# Patient Record
Sex: Female | Born: 1990 | Race: Black or African American | Hispanic: No | Marital: Single | Smoking: Never smoker
Health system: Southern US, Community
[De-identification: ages and names within clinical notes are randomized; demographics above are authoritative.]

## PROBLEM LIST (undated history)

## (undated) ENCOUNTER — Inpatient Hospital Stay (HOSPITAL_COMMUNITY): Payer: Self-pay

## (undated) ENCOUNTER — Emergency Department (HOSPITAL_COMMUNITY): Disposition: A | Discharge status: Home / Self Care | Payer: Self-pay

## (undated) HISTORY — PX: DILATION AND CURETTAGE OF UTERUS: SHX78

---
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
## 2008-03-30 ENCOUNTER — Emergency Department (HOSPITAL_COMMUNITY): Admission: EM | Admit: 2008-03-30 | Discharge: 2008-03-30 | Payer: Self-pay | Admitting: Emergency Medicine

## 2009-10-09 ENCOUNTER — Ambulatory Visit (HOSPITAL_BASED_OUTPATIENT_CLINIC_OR_DEPARTMENT_OTHER): Admission: RE | Admit: 2009-10-09 | Discharge: 2009-10-09 | Payer: Self-pay | Admitting: Obstetrics

## 2009-10-12 ENCOUNTER — Ambulatory Visit: Payer: Self-pay | Admitting: Internal Medicine

## 2011-01-08 ENCOUNTER — Ambulatory Visit (HOSPITAL_BASED_OUTPATIENT_CLINIC_OR_DEPARTMENT_OTHER): Payer: Medicaid Other | Attending: Obstetrics

## 2011-02-06 NOTE — Consult Note (Signed)
[name], [name] NO.:  [id number]   MEDICAL RECORD NO.:  [id number]          PATIENT TYPE:  INP   LOCATION:  9121                          FACILITY:  [facility]   PHYSICIAN:  Marlan Palau, M.D.  DATE OF BIRTH:  Nov 25, 1990   DATE OF CONSULTATION:  11/02/2006  DATE OF DISCHARGE:                                 CONSULTATION   HISTORY OF PRESENT ILLNESS:  Cynthia [name] this an immature, 20 year old  black female, right handed, born 04-Aug-1991, with a history of  delivery of first child on November 01, 2006.  The patient received  epidural anesthesia for the procedure.  Following the procedure on  February 11 as well as on November 02, 2006, the patient has been  complaining of some numbness in the lower anterior right thigh and  weakness in that leg with a tendency to fall.  The patient has been  seen, evaluated by anesthesiology who does not believe that the  anesthesia itself is a contributing factor.  The patient reports no pain  in the back or down the leg on either side.  Patient has no neck pain.  The patient denies any problems controlling the bowels or bladder.  For  this reason, neurology was asked to see the patient for further  evaluation.   PAST MEDICAL HISTORY:  Is significant for:  1. Right leg weakness following delivery with functional examination.  2. Status post delivery of first child November 01, 2006.   The patient does not smoke or drink.   ALLERGIES:  No known allergies.   CURRENT MEDICATIONS:  1. Iron 325 mg b.i.d.  2. Senokot 1-2 at night.  3. Tylenol if needed.  4. Tylox if needed.   SOCIAL HISTORY:  [place]; is single; lives with her parents.  This is her first child.   FAMILY MEDICAL HISTORY:  Notable that both parents are living in good  health.  The patient has one brother and one sister who are in good  health. Sister just delivered another child in the last several  days.   REVIEW OF SYSTEMS:  Notable for no recent fevers, chills.  The patient  denies headache, neck pain, shortness of breath, chest pains, abdominal  pain, nausea, vomiting, troubles controlling the bowels or bladder.  Denies any blackout episodes or dizziness.   PHYSICAL EXAMINATION:  VITAL SIGNS: Blood pressure 126/74, heart rate  72, respiratory rate 18, temperature afebrile.  GENERAL:  This patient is a minimally obese black female who is immature  with baby talk.  HEENT:  Head is atraumatic.  Eyes:  Pupils are equal, round and react to  light.  Disks are flat bilaterally.  NECK:  Supple.  No carotid bruits noted.  RESPIRATORY:  Examination is clear.  CARDIOVASCULAR:  Examination reveals a regular rate and rhythm.  No  obvious murmurs, rubs noted.  EXTREMITIES:  Are notable for 2-3+ edema on the arms and legs.  NEUROLOGIC:  Cranial nerves as above.  Facial symmetry is noted.  The  patient has good sensation  of face to pinprick, soft touch bilaterally.  Has good strength to facial muscles, muscles to head turning and  shoulder shrug bilaterally.  Speech is well enunciated, not aphasic.  Motor testing reveals 5/5 strength in all fours with exception that she  gives no effort whatsoever with extension of the knee and with elevation  the leg, will not flex at the hip or extend at the knee.  The patient  otherwise has good strength throughout.  Subsequently on cerebellar  testing, the patient readily performs heel-to-shin with the right leg  and the left, able to lift and extend and flex the right leg at the knee  and hip where moments ago this was totally flaccid, and no antigravity  movement and no visible contraction was seen.  Patient has good finger-  nose-finger bilaterally.  The patient is ambulatory, can walk  independently, can walk tandem gait, and can ambulate on the heels and  toes.  The patient's subsequently will fall backwards, staggering  around, falling across the  bed.  Deep tendon reflexes depressed but  symmetric.  Toes neutral bilaterally.   LABORATORY VALUES:  Are notable for a significant anemia with a  hemoglobin of 7.1, white count 14.4, MCV of 80.6, platelets of 136.  Sodium 136, potassium 3.9, chloride 109, CO2 22, glucose 104, BUN 9,  creatinine 0.83, alkaline phosphatase of 154, SGOT of 21, SGPT of 12,  total protein 6.3, albumin 2.9, calcium 9.1. Uric acid 5.3. LDH 167.  Urinalysis reveals specific gravity of 1.005, pH 6.0.   IMPRESSION:  1. Right lower extremity weakness that is functional in nature.  2. Significant anemia, hemoglobin of 7.1.   This patient's clinical examination confirms of functional examination.  The patient at one moment has no visible motor contraction of the  iliopsoas or quadriceps muscles, at other time moments later is able to  use the leg normally.  There is no indication for further neurologic  workup at this point.  The patient may be discharged on the usual  planned schedule.      Marlan Palau, M.D.  Electronically Signed     CKW/MEDQ  D:  11/02/2006  T:  11/02/2006  Job:  [id number]   cc:   [facility]  [address]   Kathreen Cosier, M.D.  Fax: [phone]

## 2011-02-06 NOTE — Op Note (Signed)
NAMEFRANCIA, VERRY               ACCOUNT NO.:  [id number]   MEDICAL RECORD NO.:  [id number]          PATIENT TYPE:  INP   LOCATION:  9169                          FACILITY:  [facility]   PHYSICIAN:  Kathreen Cosier, M.D.DATE OF BIRTH:  1990-11-30   DATE OF PROCEDURE:  11/01/2006  DATE OF DISCHARGE:                               OPERATIVE REPORT   The patient is a 20 year old prima gravida at term who became fully  dilated at 5:30 a.m. on February 11, posterior +3 station, needed  assistance.  Midline episiotomy cut vacuum applied through 1  contraction.  No popoff until delivery achieved.  She had a female, Apgars  of 8 and 9 weighing 7 pounds 7 ounces LOA, fourth degree repaired in  layers.  Rectum and placenta, anus intact.  Placenta spontaneous.  The  patient tolerated procedure well.           ______________________________  Kathreen Cosier, M.D.     BAM/MEDQ  D:  11/01/2006  T:  11/01/2006  Job:  [id number]

## 2011-02-23 ENCOUNTER — Ambulatory Visit (HOSPITAL_BASED_OUTPATIENT_CLINIC_OR_DEPARTMENT_OTHER): Payer: Medicaid Other

## 2011-03-10 ENCOUNTER — Ambulatory Visit (HOSPITAL_BASED_OUTPATIENT_CLINIC_OR_DEPARTMENT_OTHER): Payer: Medicaid Other | Attending: Obstetrics

## 2011-03-10 DIAGNOSIS — G4710 Hypersomnia, unspecified: Secondary | ICD-10-CM | POA: Insufficient documentation

## 2011-03-15 DIAGNOSIS — G4730 Sleep apnea, unspecified: Secondary | ICD-10-CM

## 2011-03-15 DIAGNOSIS — G4710 Hypersomnia, unspecified: Secondary | ICD-10-CM

## 2011-03-16 NOTE — Procedures (Signed)
[name], Cynthia [name]               ACCOUNT NO.:  [id number]  MEDICAL RECORD NO.:  [id number]          PATIENT TYPE:  OUT  LOCATION:  SLEEP CENTER                 FACILITY:  [facility]  PHYSICIAN:  Clinton D. Maple Hudson, MD, FCCP, FACPDATE OF BIRTH:  [birth year]04[birth year]1988  DATE OF STUDY:  03/10/2011                           NOCTURNAL POLYSOMNOGRAM  REFERRING PHYSICIAN:  Kathreen Cosier, M.D.  INDICATION FOR STUDY:  Hypersomnia with sleep apnea.  EPWORTH SLEEPINESS SCORE:  23/24, BMI 26.7.  Weight 132 pounds, height 59 inches.  Neck 12.5 inches.  HOME MEDICATION:  None listed.  Baseline diagnostic NPSG on October 09, 2009, recorded an AHI of 0.2 per hour and RDI of 20.4 per hour.  CPAP titration is now requested.  MEDICATIONS:  SLEEP ARCHITECTURE:  Total sleep time 314 minutes with sleep efficiency 85.6%.  Stage I was 11.6%, stage II 55.9%, stage III 10.2%, REM 22.3% of total sleep time.  Sleep latency 7.5 minutes, REM latency 17 minutes, awake after sleep onset 46.5 minutes, arousal index 20.8.  BEDTIME MEDICATION:  None.  RESPIRATORY DATA:  CPAP titration protocol.  CPAP was titrated to 12 CWP, AHI 0 per hour.  She wore a small ResMed Mirage Quattro full-face mask with heated humidifier and a C-Flex in the setting of 3.  OXYGEN DATA:  Before CPAP snoring was moderate.  With CPAP titration, mean oxygen saturation was 98% on room air and snoring was prevented.  CARDIAC DATA:  Sinus rhythm with occasional PAC and PVC.  MOVEMENT-PARASOMNIA:  Occasional limb jerk with minimal effect on sleep. Bathroom x1.  IMPRESSIONS-RECOMMENDATIONS: 1. CPAP titration to 12 CWP, AHI 0 per hour.  She wore a small ResMed     Mirage Quattro full-face mask with heated humidifier and a C-Flex     in the setting of 3. 2. Baseline diagnostic NPSG on October 09, 2009, had recorded an AHI     of 0.2 per hour and RDI of 20.4 per hour.     Clinton D. Maple Hudson, MD, [facility], FACP Diplomate, Biomedical engineer of Sleep  Medicine Electronically Signed    CDY/MEDQ  D:  03/15/2011 20:45:32  T:  03/16/2011 04:47:29  Job:  [id number]

## 2011-05-22 ENCOUNTER — Ambulatory Visit (INDEPENDENT_AMBULATORY_CARE_PROVIDER_SITE_OTHER): Payer: Medicaid Other | Admitting: Internal Medicine

## 2011-05-22 ENCOUNTER — Encounter: Payer: Self-pay | Admitting: Internal Medicine

## 2011-05-22 VITALS — BP 138/70 | HR 78 | Ht 60.0 in | Wt 126.6 lb

## 2011-05-22 DIAGNOSIS — G47411 Narcolepsy with cataplexy: Secondary | ICD-10-CM

## 2011-05-22 DIAGNOSIS — G47419 Narcolepsy without cataplexy: Secondary | ICD-10-CM | POA: Insufficient documentation

## 2011-05-22 DIAGNOSIS — G4710 Hypersomnia, unspecified: Secondary | ICD-10-CM

## 2011-05-22 HISTORY — DX: Narcolepsy without cataplexy: G47.419

## 2011-05-22 NOTE — Progress Notes (Signed)
Subjective:    Patient ID: Cynthia [name], female    DOB: 12/15/1990, 20 y.o.   MRN: [id number]  HPI 05/22/11- 20 year old female never smoker referred courtesy of Dr. Francoise Ceo for sleep evaluation because of hypersomnia.  Describes attacks of irresistible sleepiness- not clearly related to emotion or laughter. Onset noted after childbirth in 2008. Denies hx drugs, trauma or similar family hx. Caffeine is no help. I reviewed her recent NPSG. I don't feel she has significant sleep apnea, although she never lost weight after childbirth and began snoring. Not told of apnea, movement or behavior disturbance during sleep. Denies hypnagogic hallucination or sleep paralysis. Never needed naps her before child was born. Now needs naps but does not find them especially refreshing. Sleep habits were reviewed: Bedtime 11 PM to midnight, sleep latency "2 seconds", waking 3 or 4 times briefly during the night for up at 7 AM. NPSG- 10/09/09- AHI 0.2 per hour, RDI 20.4 per hour intermittent moderate snoring-Epworth Sleepiness Scale or 5/24 BMI 26.5 weight 131 pounds CPAP titration- 03/10/2011 12 CWP, AHI 0 per hour Denies history of liver or thyroid disease, head trauma or meningitis, epilepsy  Review of Systems Constitutional:   No-   weight loss, night sweats, fevers, chills,    + fatigue, lassitude. HEENT:   No-  headaches, difficulty swallowing, tooth/dental problems, sore throat,       No-  sneezing, itching, ear ache, nasal congestion, post nasal drip,  CV:  No-   chest pain, orthopnea, PND, swelling in lower extremities, anasarca, dizziness, palpitations Resp: No-   shortness of breath with exertion or at rest.              No-   productive cough,  No non-productive cough,  No-  coughing up of blood.              No-   change in color of mucus.  No- wheezing.   Skin: No-   rash or lesions. GI:  No-   heartburn, indigestion, abdominal pain, nausea, vomiting, diarrhea,                 change in  bowel habits, loss of appetite GU: No-   dysuria, change in color of urine, no urgency or frequency.  No- flank pain. MS:  No-   joint pain or swelling.  No- decreased range of motion.  No- back pain. Neuro- grossly normal to observation, Or:  Psych:  No- change in mood or affect. No depression or anxiety.  No memory loss.      Objective:   Physical Exam General- Alert, Oriented, Affect-appropriate, Distress- none acute, well-developed well-nourished, not obese;  tearful Skin- rash-none, lesions- none, excoriation- none Lymphadenopathy- none Head- atraumatic            Eyes- Gross vision intact, PERRLA, conjunctivae clear secretions            Ears- Hearing, canals-normal            Nose- Clear, no-Septal dev, mucus, polyps, erosion, perforation             Throat- Mallampati II , mucosa clear , drainage- none, tonsils- atrophic Neck- flexible , trachea midline, no stridor , thyroid nl, carotid no bruit Chest - symmetrical excursion , unlabored           Heart/CV- RRR , no murmur , no gallop  , no rub, nl s1 s2                           -  JVD- none , edema- none, stasis changes- none, varices- none           Lung- clear to P&A, wheeze- none, cough- none , dullness-none, rub- none           Chest wall-  Abd- tender-no, distended-no, bowel sounds-present, HSM- no Br/ Gen/ Rectal- Not done, not indicated Extrem- cyanosis- none, clubbing, none, atrophy- none, strength- nl Neuro- grossly intact to observation         Assessment & Plan:

## 2011-05-22 NOTE — Patient Instructions (Signed)
Order [facility]- Schedule Multiple Sleep Latency Test  For dx hypersomnia, narcolepsy with cataplexy

## 2011-05-22 NOTE — Assessment & Plan Note (Signed)
I suspect this will turn out to be either idiopathic hypersomnia or narcolepsy. Treatment will probably involve stimulant meds, so it will be important to document as well as possible. We discussed and will schedule a Multiple Sleep Latency Test.

## 2011-06-08 ENCOUNTER — Ambulatory Visit (HOSPITAL_BASED_OUTPATIENT_CLINIC_OR_DEPARTMENT_OTHER): Payer: Medicaid Other

## 2011-06-11 ENCOUNTER — Other Ambulatory Visit: Payer: Self-pay | Admitting: Internal Medicine

## 2011-06-11 DIAGNOSIS — G4710 Hypersomnia, unspecified: Secondary | ICD-10-CM

## 2011-06-11 DIAGNOSIS — G47411 Narcolepsy with cataplexy: Secondary | ICD-10-CM

## 2011-06-17 ENCOUNTER — Encounter (HOSPITAL_BASED_OUTPATIENT_CLINIC_OR_DEPARTMENT_OTHER): Payer: Medicaid Other

## 2011-06-24 ENCOUNTER — Encounter (HOSPITAL_BASED_OUTPATIENT_CLINIC_OR_DEPARTMENT_OTHER): Payer: Medicaid Other

## 2011-07-06 ENCOUNTER — Ambulatory Visit (HOSPITAL_BASED_OUTPATIENT_CLINIC_OR_DEPARTMENT_OTHER): Payer: Medicaid Other | Attending: Internal Medicine

## 2011-07-06 DIAGNOSIS — G4710 Hypersomnia, unspecified: Secondary | ICD-10-CM | POA: Insufficient documentation

## 2011-07-07 ENCOUNTER — Ambulatory Visit: Payer: Medicaid Other | Admitting: Internal Medicine

## 2011-07-11 DIAGNOSIS — G4710 Hypersomnia, unspecified: Secondary | ICD-10-CM

## 2011-07-11 NOTE — Procedures (Signed)
Cynthia [name], KIDNEY               ACCOUNT NO.:  [id number]  MEDICAL RECORD NO.:  [id number]          PATIENT TYPE:  OUT  LOCATION:  SLEEP CENTER                 FACILITY:  [facility]  PHYSICIAN:  Evolet Salminen D. Maple Hudson, MD, FCCP, FACPDATE OF BIRTH:  1989-06-30  DATE OF STUDY:  07/06/2011                         MULTIPLE SLEEP LATENCY TEST  REFERRING PHYSICIAN:  Zebulen Simonis D. Haset [place], MD, FCCP, FACP  INDICATION FOR STUDY:  Hypersomnia, suspect narcoplexy.  EPWORTH SLEEPINESS SCORE:  Epworth sleepiness score 23/24.  BMI:  BMI 24, height 51 inches, weight 127 pounds, neck 12.5 inches.  A baseline diagnostic NPSG on October 09, 2009 recorded an AHI of 0.2 per hour and an RDI of 20.4 per hour with 22% REM.  Weight was 131 pounds.  MEDICATIONS:  None.  NAP 1:  9:00 a.m., sleep latency 2 minutes, REM latency 1.5 minutes.  NAP 2:  11:00 a.m., sleep latency 2 minutes, REM latency 0.5 minutes.  NAP 3:  13:00 p.m., sleep latency 1 minute, REM latency 1 minute.  NAP 4:  15:00 p.m., sleep latency 1.5 minutes, REM latency 1 minute.  NAP 5:  17:00 p.m., sleep latency 4 minutes, REM latency 0.5 minutes. No medications were taken throughout the day.  Negligible snoring. Normal heart rhythm.   MEAN SLEEP LATENCY:  NUMBER OF REM EPISODES:  COMMENTS:  IMPRESSIONS-RECOMMENDATIONS: 1. Mean sleep latency 2.1 minute with sleep onset REM associated with     all 5 naps. 2. 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  There is severe daytime     hypersomnia.     Liron Eissler D. Maple Hudson, MD, [facility], FACP Diplomate, Biomedical engineer of Sleep Medicine Electronically Signed    CDY/MEDQ  D:  07/11/2011 08:54:39  T:  07/11/2011 16:10:96  Job:  [id number]

## 2011-07-21 ENCOUNTER — Ambulatory Visit (INDEPENDENT_AMBULATORY_CARE_PROVIDER_SITE_OTHER): Payer: Medicaid Other | Admitting: Internal Medicine

## 2011-07-21 ENCOUNTER — Encounter: Payer: Self-pay | Admitting: Internal Medicine

## 2011-07-21 VITALS — BP 138/88 | HR 90 | Ht 60.0 in | Wt 127.8 lb

## 2011-07-21 DIAGNOSIS — G47419 Narcolepsy without cataplexy: Secondary | ICD-10-CM

## 2011-07-21 MED ORDER — METHYLPHENIDATE HCL ER (LA) 20 MG PO CP24: ORAL_CAPSULE | ORAL | Status: DC

## 2011-07-21 NOTE — Patient Instructions (Addendum)
Narcolepsy- information print-out for patient education  Script methylphenidate 20 mg LA---- take one before breakfast daily. You can take a second later- perhaps around lunch time, if needed.   Please call as needed

## 2011-07-21 NOTE — Assessment & Plan Note (Signed)
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 Medication and lifestyle management education done. We will start with methylphenidate.

## 2011-07-21 NOTE — Progress Notes (Signed)
Patient ID: Cynthia [name], female    DOB: June 10, 1991, 20 y.o.   MRN: [id number]  HPI 05/22/11- 20 year old female never smoker referred courtesy of Dr. Francoise Ceo for sleep evaluation because of hypersomnia.  Describes attacks of irresistible sleepiness- not clearly related to emotion or laughter. Onset noted after childbirth in 2008. Denies hx drugs, trauma or similar family hx. Caffeine is no help. I reviewed her recent NPSG. I don't feel she has significant sleep apnea, although she never lost weight after childbirth and began snoring. Not told of apnea, movement or behavior disturbance during sleep. Denies hypnagogic hallucination or sleep paralysis. Never needed naps her before child was born. Now needs naps but does not find them especially refreshing. Sleep habits were reviewed: Bedtime 11 PM to midnight, sleep latency "2 seconds", waking 3 or 4 times briefly during the night for up at 7 AM. NPSG- 10/09/09- AHI 0.2 per hour, RDI 20.4 per hour intermittent moderate snoring-Epworth Sleepiness Scale - 5/24, BMI 26.5, weight 131 pounds CPAP titration- 03/10/2011 12 CWP, AHI 0 per hour Denies history of liver or thyroid disease, head trauma or meningitis, epilepsy  07/21/11- 20 yoF never smoker followed for Narcolepsy with Hypersomnia She returns after multiple sleep latency test done 07/06/2011 which was significantly abnormal. Mean sleep latency was only 2.1 minutes (normal is over 10 minutes and severe sleepiness is considered less than 5 minutes). She had REM sleep on all 5 naps. This is strongly consistent with narcolepsy. So far, I have not gotten a history of cataplexy. We talked today about narcolepsy and available treatments. I emphasized the importance of good sleep hygiene and the preference of naps over drugs and possible. We talked very carefully about the use of controlled medications. She was initially somewhat tearful and apprehensive, but understands that we will work through  this with her.  Review of Systems Constitutional:   No-   weight loss, night sweats, fevers, chills,    + fatigue, lassitude. HEENT:   No-  headaches, difficulty swallowing, tooth/dental problems, sore throat,       No-  sneezing, itching, ear ache, nasal congestion, post nasal drip,  CV:  No-   chest pain, orthopnea, PND, swelling in lower extremities, anasarca, dizziness, palpitations Resp: No-   shortness of breath with exertion or at rest.              No-   productive cough,  No non-productive cough,  No-  coughing up of blood.              No-   change in color of mucus.  No- wheezing.   Skin: No-   rash or lesions. GI:  No-   heartburn, indigestion, abdominal pain, nausea, vomiting, diarrhea,                 change in bowel habits, loss of appetite GU: No-   dysuria, change in color of urine, no urgency or frequency.  No- flank pain. MS:  No-   joint pain or swelling.  No- decreased range of motion.  No- back pain. Neuro- grossly normal to observation, Or:  Psych:  No- change in mood or affect. No depression or anxiety.  No memory loss.      Objective:   Physical Exam General- Alert, Oriented, Affect-appropriate, Distress- none acute, well-developed well-nourished, not obese;  tearful Skin- rash-none, lesions- none, excoriation- none Lymphadenopathy- none Head- atraumatic            Eyes- Gross  vision intact, PERRLA, conjunctivae clear secretions            Ears- Hearing, canals-normal            Nose- Clear, no-Septal dev, mucus, polyps, erosion, perforation             Throat- Mallampati II , mucosa clear , drainage- none, tonsils- atrophic Neck- flexible , trachea midline, no stridor , thyroid nl, carotid no bruit Chest - symmetrical excursion , unlabored           Heart/CV- RRR , no murmur , no gallop  , no rub, nl s1 s2                           - JVD- none , edema- none, stasis changes- none, varices- none           Lung- clear to P&A, wheeze- none, cough- none ,  dullness-none, rub- none           Chest wall-  Abd- tender-no, distended-no, bowel sounds-present, HSM- no Br/ Gen/ Rectal- Not done, not indicated Extrem- cyanosis- none, clubbing, none, atrophy- none, strength- nl Neuro- grossly intact to observation

## 2011-07-27 ENCOUNTER — Encounter: Payer: Self-pay | Admitting: Internal Medicine

## 2011-08-17 ENCOUNTER — Inpatient Hospital Stay (HOSPITAL_COMMUNITY): Payer: Medicaid Other

## 2011-08-17 ENCOUNTER — Encounter (HOSPITAL_COMMUNITY): Payer: Self-pay | Admitting: *Deleted

## 2011-08-17 ENCOUNTER — Inpatient Hospital Stay (HOSPITAL_COMMUNITY)
Admission: AD | Admit: 2011-08-17 | Discharge: 2011-08-17 | Disposition: A | Discharge status: Home / Self Care | Payer: Medicaid Other | Source: Ambulatory Visit | Attending: Obstetrics | Admitting: Obstetrics

## 2011-08-17 DIAGNOSIS — O210 Mild hyperemesis gravidarum: Secondary | ICD-10-CM | POA: Insufficient documentation

## 2011-08-17 DIAGNOSIS — O219 Vomiting of pregnancy, unspecified: Secondary | ICD-10-CM

## 2011-08-17 LAB — WET PREP, GENITAL: Clue Cells Wet Prep HPF POC: NONE SEEN

## 2011-08-17 LAB — URINALYSIS, ROUTINE W REFLEX MICROSCOPIC
Ketones, ur: >80 mg/dL — AB
Nitrite: NEGATIVE
Specific Gravity, Urine: >1.03 — ABNORMAL HIGH (ref 1.005–1.030)
Urobilinogen, UA: 0.2 mg/dL (ref 0.0–1.0)

## 2011-08-17 LAB — COMPREHENSIVE METABOLIC PANEL
AST: 14 U/L (ref 0–37)
Alkaline Phosphatase: 41 U/L (ref 39–117)
CO2: 21 mEq/L (ref 19–32)
Calcium: 10.2 mg/dL (ref 8.4–10.5)
Creatinine, Ser: 0.77 mg/dL (ref 0.50–1.10)
Glucose, Bld: 85 mg/dL (ref 70–99)
Total Bilirubin: 0.7 mg/dL (ref 0.3–1.2)
Total Protein: 7.5 g/dL (ref 6.0–8.3)

## 2011-08-17 LAB — POCT PREGNANCY, URINE: Preg Test, Ur: POSITIVE

## 2011-08-17 LAB — URINE MICROSCOPIC-ADD ON

## 2011-08-17 LAB — CBC
HCT: 33.8 % — ABNORMAL LOW (ref 36.0–46.0)
MCH: 26.7 pg (ref 26.0–34.0)
MCV: 77.2 fL — ABNORMAL LOW (ref 78.0–100.0)
Platelets: 234 10*3/uL (ref 150–400)
RDW: 15.3 % (ref 11.5–15.5)

## 2011-08-17 MED ORDER — PROMETHAZINE HCL 25 MG PO TABS: 12.5000 mg | ORAL_TABLET | Freq: Four times a day (QID) | ORAL | Status: DC | PRN

## 2011-08-17 MED ORDER — LACTATED RINGERS IV SOLN
INTRAVENOUS | Status: DC
  Administered 2011-08-17: 16:00:00 via INTRAVENOUS

## 2011-08-17 MED ORDER — ONDANSETRON HCL 4 MG/2ML IJ SOLN
4.0000 mg | Freq: Once | INTRAMUSCULAR | Status: AC
  Administered 2011-08-17: 4 mg via INTRAVENOUS
  Filled 2011-08-17: qty 2

## 2011-08-17 NOTE — ED Provider Notes (Signed)
History    Cynthia [name] is a 20 YO female presents today with intermittent nausea and vomiting for 4 days.  HPI Patient reports she was otherwise healthy 4 days ago on Thanksgiving day. It wasn't until late afternoon did she begin to feel nauseated. She had one episode of vomiting that night and it had resolved itself. The two days following that patient reports she felt fine, no episodes of vomiting or nausea. Patient denies problems with urinating or with bowel movements. Yesterday afternoon, patient suddenly felt extremely nauseated while at the store and had to vomit in the restroom. She has since been suffering from nausea and vomiting throughout the night and today. Patient has not taken any medications and has been unable to keep food or liquids down.   Patient reports she often feels chills then gets warm after vomiting episodes. Denies fevers, headaches, recent illness. Patient's last menstrual period was mid October, she was unsure of specific dates. Patient currently not taking birth control.  Past Medical History  Diagnosis Date  . Narcolepsy     Past Surgical History  Procedure Date  . No past surgeries     No family history on file.  History  Substance Use Topics  . Smoking status: Never Smoker   . Smokeless tobacco: Never Used  . Alcohol Use: Yes     socially    Allergies: No Known Allergies  Prescriptions prior to admission  Medication Sig Dispense Refill  . methylphenidate (RITALIN LA) 20 MG 24 hr capsule 1 twice daily as neeeded  50 capsule  0    Review of Systems  Constitutional: Positive for chills. Negative for fever.  Gastrointestinal: Positive for nausea, vomiting and abdominal pain.  Genitourinary: Negative for dysuria.  Neurological: Negative for headaches.   Physical Exam   Blood pressure 128/75, pulse 70, temperature 98.6 F (37 C), temperature source Oral, resp. rate 18, height 4\' 11"  (1.499 m), weight 54.885 kg (121 lb), last menstrual  period 07/19/2011.  Physical Exam  Constitutional: She is oriented to person, place, and time. She appears well-developed and well-nourished.  Cardiovascular: Normal rate, regular rhythm and normal heart sounds.   Respiratory: Effort normal and breath sounds normal.  GI: Soft. Bowel sounds are normal. There is no tenderness.  Musculoskeletal: Normal range of motion.  Neurological: She is alert and oriented to person, place, and time.  Skin: Skin is warm.    MAU Course  Procedures Results for orders placed during the hospital encounter of 08/17/11 (from the past 24 hour(s))  URINALYSIS, ROUTINE W REFLEX MICROSCOPIC     Status: Abnormal   Collection Time   08/17/11  3:00 PM      Component Value Range   Color, Urine YELLOW  YELLOW    Appearance HAZY (*) CLEAR    Specific Gravity, Urine >1.030 (*) 1.005 - 1.030    pH 6.0  5.0 - 8.0    Glucose, UA NEGATIVE  NEGATIVE (mg/dL)   Hgb urine dipstick NEGATIVE  NEGATIVE    Bilirubin Urine NEGATIVE  NEGATIVE    Ketones, ur >80 (*) NEGATIVE (mg/dL)   Protein, ur 30 (*) NEGATIVE (mg/dL)   Urobilinogen, UA 0.2  0.0 - 1.0 (mg/dL)   Nitrite NEGATIVE  NEGATIVE    Leukocytes, UA NEGATIVE  NEGATIVE   URINE MICROSCOPIC-ADD ON     Status: Abnormal   Collection Time   08/17/11  3:00 PM      Component Value Range   Squamous Epithelial / LPF FEW (*)  RARE    WBC, UA 0-2  <3 (WBC/hpf)   Bacteria, UA FEW (*) RARE    Urine-Other MUCOUS PRESENT    POCT PREGNANCY, URINE     Status: Normal   Collection Time   08/17/11  3:28 PM      Component Value Range   Preg Test, Ur POSITIVE    CBC     Status: Abnormal   Collection Time   08/17/11  4:25 PM      Component Value Range   WBC 11.6 (*) 4.0 - 10.5 (K/uL)   RBC 4.38  3.87 - 5.11 (MIL/uL)   Hemoglobin 11.7 (*) 12.0 - 15.0 (g/dL)   HCT 16.1 (*) 09.6 - 46.0 (%)   MCV 77.2 (*) 78.0 - 100.0 (fL)   MCH 26.7  26.0 - 34.0 (pg)   MCHC 34.6  30.0 - 36.0 (g/dL)   RDW 04.5  40.9 - 81.1 (%)   Platelets 234   150 - 400 (K/uL)    MDM UA, CBC Ultrasound to confirm pregnancy   Assessment and Plan  1. Pregnancy: Patient was confirmed to be pregnant via ultrasonography. Patient was advised to seek prenatal care and follow up. Patient was given oral phenergan to help with nausea and encouraged to drink water to avoid dehydration.    Cynthia Putt, PA-S 08/17/2011, 3:40 PM    Pelvic exam: External genitalia without lesions. White discharge vaginal vault. Cervix closed, long, no CMT, no adnexal tenderness. Uterus slightly enlarged.  Ultrasound shows a 5 week 5 day IUP with EDD of 04/13/12.  Since she is a regular patient of Dr. Gaynell Face she will call the office tomorrow for follow up.   After IV hydration and zofran the patient is feeling much better and states she is ready to go home. Will d/c home with phenergan for nausea. Pregnancy verification letter given.  [place], [place] 08/17/11 1750

## 2011-08-17 NOTE — Progress Notes (Signed)
Started on Thanksgiving.  Worse yesterday.  Ongoing nausea and vomiting.  Hot/cold.

## 2011-08-21 ENCOUNTER — Ambulatory Visit: Payer: Self-pay | Admitting: Internal Medicine

## 2011-08-23 ENCOUNTER — Encounter (HOSPITAL_COMMUNITY): Payer: Self-pay | Admitting: *Deleted

## 2011-08-23 ENCOUNTER — Inpatient Hospital Stay (HOSPITAL_COMMUNITY)
Admission: AD | Admit: 2011-08-23 | Discharge: 2011-08-23 | Disposition: A | Discharge status: Home / Self Care | Payer: Medicaid Other | Source: Ambulatory Visit | Attending: Obstetrics | Admitting: Obstetrics

## 2011-08-23 DIAGNOSIS — O219 Vomiting of pregnancy, unspecified: Secondary | ICD-10-CM

## 2011-08-23 DIAGNOSIS — O210 Mild hyperemesis gravidarum: Secondary | ICD-10-CM | POA: Insufficient documentation

## 2011-08-23 LAB — URINALYSIS, ROUTINE W REFLEX MICROSCOPIC
Glucose, UA: NEGATIVE mg/dL
Nitrite: NEGATIVE
Specific Gravity, Urine: >1.03 — ABNORMAL HIGH (ref 1.005–1.030)
Urobilinogen, UA: 2 mg/dL — ABNORMAL HIGH (ref 0.0–1.0)
pH: 6 (ref 5.0–8.0)

## 2011-08-23 LAB — URINE MICROSCOPIC-ADD ON

## 2011-08-23 MED ORDER — PROMETHAZINE HCL 25 MG/ML IJ SOLN
25.0000 mg | Freq: Once | INTRAVENOUS | Status: AC
  Administered 2011-08-23: 25 mg via INTRAVENOUS
  Filled 2011-08-23: qty 1

## 2011-08-23 NOTE — ED Provider Notes (Signed)
History     Chief Complaint  Patient presents with  . Emesis During Pregnancy   HPICherish P [name] is 20 y.o. G2P1001 [redacted]w[redacted]d weeks presenting with complaint of nausea and vomiting.  She was seen on 11/26 with full workup.  Cultures are negative.   She has vomited X 13 times and is "miserable".  She plans to terminate the pregnancy tomorrow.  Denies fever or body aches.  Is unable to keep any food down.      Past Medical History  Diagnosis Date  . Narcolepsy     Past Surgical History  Procedure Date  . No past surgeries     No family history on file.  History  Substance Use Topics  . Smoking status: Never Smoker   . Smokeless tobacco: Never Used  . Alcohol Use: Yes     socially    Allergies: No Known Allergies  Prescriptions prior to admission  Medication Sig Dispense Refill  . methylphenidate (RITALIN LA) 20 MG 24 hr capsule 1 twice daily as neeeded  50 capsule  0  . promethazine (PHENERGAN) 25 MG tablet Take 0.5 tablets (12.5 mg total) by mouth every 6 (six) hours as needed for nausea.  20 tablet  0    Review of Systems  Constitutional: Negative for fever and chills.  Gastrointestinal: Positive for nausea and vomiting. Negative for abdominal pain.  Genitourinary: Negative.        Negative for vaginal bleeding   Physical Exam   Blood pressure 148/90, pulse 76, temperature 99.1 F (37.3 C), resp. rate 18, height 5\' 1"  (1.549 m), weight 120 lb (54.432 kg), last menstrual period 07/19/2011.  Physical Exam  Constitutional: She is oriented to person, place, and time. She appears well-developed and well-nourished. Distressed: spitting/vomiting, looks uncomfortable.  HENT:  Head: Normocephalic.  Neck: Normal range of motion.  Respiratory: Effort normal.  GI: Soft.  Genitourinary:       Not indicated at this time  Neurological: She is alert and oriented to person, place, and time.  Skin: Skin is warm and dry.   Results for orders placed during the hospital  encounter of 08/23/11 (from the past 24 hour(s))  URINALYSIS, ROUTINE W REFLEX MICROSCOPIC     Status: Abnormal   Collection Time   08/23/11  7:40 PM      Component Value Range   Color, Urine STRAW (*) YELLOW    APPearance CLEAR  CLEAR    Specific Gravity, Urine >1.030 (*) 1.005 - 1.030    pH 6.0  5.0 - 8.0    Glucose, UA NEGATIVE  NEGATIVE (mg/dL)   Hgb urine dipstick NEGATIVE  NEGATIVE    Bilirubin Urine SMALL (*) NEGATIVE    Ketones, ur >80 (*) NEGATIVE (mg/dL)   Protein, ur 30 (*) NEGATIVE (mg/dL)   Urobilinogen, UA 2.0 (*) 0.0 - 1.0 (mg/dL)   Nitrite NEGATIVE  NEGATIVE    Leukocytes, UA NEGATIVE  NEGATIVE   URINE MICROSCOPIC-ADD ON     Status: Abnormal   Collection Time   08/23/11  7:40 PM      Component Value Range   Squamous Epithelial / LPF MANY (*) RARE    WBC, UA 0-2  <3 (WBC/hpf)   Bacteria, UA FEW (*) RARE    Urine-Other MUCOUS PRESENT      MAU Course  Procedures  Cultures here last week were negative  MDM  1 liter of D5LR with Phenergan 25mg  ordered to be bolused.  Explained plan of care with the  patient.  She has a ride home.   Care of patient turned over to D. Tuan Tippin, CNM   Assessment and Plan    KEY,EVE M 08/23/2011, 7:43 PM   Matt Holmes, NP  Addendum: Received 1 liter IVF with Phenergan, feels better and retaining po fluids.  Initial BP elevated so will recheck. F/U with primary care provider.  Hubert Raatz 12/2/20129:23 PM   08/23/11 2038

## 2011-08-23 NOTE — Progress Notes (Signed)
E. Key, NP at bedside. Assessment done and poc discussed with pt.  

## 2011-08-23 NOTE — Progress Notes (Signed)
Pt reports for the last 2 days has been unable to keep anything down. No meds at home. Has vomited about 10 times today.

## 2011-08-23 NOTE — Progress Notes (Signed)
IV dc'd for discharge.  Instructed pt to call for ride.  Pt states she is too cold to uncover to get her phone.  Phone given to pt. No answer on phone.  Instructed to leave message.  Pt states " I don't have to leave a damn message".  Instructed pt that her discharge is complete and she needs to call a ride home.

## 2011-08-23 NOTE — Progress Notes (Signed)
Pt has not thrown up since IV fluid administration.  Has tolerated PO fluids.

## 2011-10-06 ENCOUNTER — Telehealth: Payer: Self-pay | Admitting: Internal Medicine

## 2011-10-06 NOTE — Telephone Encounter (Signed)
I spoke with pt and she is requesting something different for her narcolepsy. Pt states she does not want to take the ritalin bc she is afraid of the side effects and that it "might change her". Pt aware CDY is out of the office but will return tomorrow. Please advise Dr. Maple Hudson, thanks  No Known Allergies

## 2011-10-07 NOTE — Telephone Encounter (Signed)
Per CY-"I need to see her again, this isn't a good condition to treat by phone". Please work her in on Thursday 10-08-2011 in 1115 am slot.

## 2011-10-07 NOTE — Telephone Encounter (Signed)
Pt returned call.  Set appt w/ CY for 10/08/2011 15 11:15 am as instructed in previous message.  Antionette Fairy

## 2011-10-07 NOTE — Telephone Encounter (Signed)
LMOMTCB x 1 

## 2011-10-08 ENCOUNTER — Ambulatory Visit: Payer: Medicaid Other | Admitting: Internal Medicine

## 2011-10-12 ENCOUNTER — Ambulatory Visit: Payer: Medicaid Other | Admitting: Internal Medicine

## 2011-11-05 ENCOUNTER — Ambulatory Visit (INDEPENDENT_AMBULATORY_CARE_PROVIDER_SITE_OTHER): Payer: Medicaid Other | Admitting: Internal Medicine

## 2011-11-05 ENCOUNTER — Encounter: Payer: Self-pay | Admitting: Internal Medicine

## 2011-11-05 VITALS — BP 118/74 | HR 77 | Ht 61.0 in | Wt 122.0 lb

## 2011-11-05 DIAGNOSIS — G47419 Narcolepsy without cataplexy: Secondary | ICD-10-CM

## 2011-11-05 MED ORDER — AMPHETAMINE-DEXTROAMPHET ER 15 MG PO CP24: ORAL_CAPSULE | ORAL | Status: DC

## 2011-11-05 NOTE — Patient Instructions (Signed)
Script for Adderall XR   To use as needed  Please drive carefully and be sure to get naps when you can.    Please call as needed

## 2011-11-05 NOTE — Progress Notes (Signed)
Patient ID: Cynthia [name], female    DOB: 11-02-1990, 21 y.o.   MRN: [id number]  HPI 05/22/11- 21 year old female never smoker referred courtesy of Dr. Francoise Ceo for sleep evaluation because of hypersomnia.  Describes attacks of irresistible sleepiness- not clearly related to emotion or laughter. Onset noted after childbirth in 2008. Denies hx drugs, trauma or similar family hx. Caffeine is no help. I reviewed her recent NPSG. I don't feel she has significant sleep apnea, although she never lost weight after childbirth and began snoring. Not told of apnea, movement or behavior disturbance during sleep. Denies hypnagogic hallucination or sleep paralysis. Never needed naps her before child was born. Now needs naps but does not find them especially refreshing. Sleep habits were reviewed: Bedtime 11 PM to midnight, sleep latency "2 seconds", waking 3 or 4 times briefly during the night for up at 7 AM. NPSG- 10/09/09- AHI 0.2 per hour, RDI 20.4 per hour intermittent moderate snoring-Epworth Sleepiness Scale - 5/24, BMI 26.5, weight 131 pounds CPAP titration- 03/10/2011 12 CWP, AHI 0 per hour Denies history of liver or thyroid disease, head trauma or meningitis, epilepsy  07/21/11- 20 yoF never smoker followed for Narcolepsy with Hypersomnia She returns after multiple sleep latency test done 07/06/2011 which was significantly abnormal. Mean sleep latency was only 2.1 minutes (normal is over 10 minutes and severe sleepiness is considered less than 5 minutes). She had REM sleep on all 5 naps. This is strongly consistent with narcolepsy. So far, I have not gotten a history of cataplexy. We talked today about narcolepsy and available treatments. I emphasized the importance of good sleep hygiene and the preference of naps over drugs and possible. We talked very carefully about the use of controlled medications. She was initially somewhat tearful and apprehensive, but understands that we will work through  this with her.  11/05/11- 20 yoF never smoker followed for Narcolepsy with Hypersomnia Pt states never filled rx for ritalin- fears dependence and s/e. Here today to discuss other options. She states still stays very tired during the day and takes naps 3 to 4 times per day We discussed the nature of narcolepsy, sleep hygiene, naps and available stimulant medication therapies. We reviewed Ep[facility] listing for adderall.  Review of Systems Constitutional:   No-   weight loss, night sweats, fevers, chills,    + fatigue, lassitude. HEENT:   No-  headaches, difficulty swallowing, tooth/dental problems, sore throat,       No-  sneezing, itching, ear ache, nasal congestion, post nasal drip,  CV:  No-   chest pain, orthopnea, PND, swelling in lower extremities, anasarca, dizziness, palpitations Resp: No-   shortness of breath with exertion or at rest.              No-   productive cough,  No non-productive cough,  No-  coughing up of blood.              No-   change in color of mucus.  No- wheezing.   Skin: No-   rash or lesions. GI:  No-   heartburn, indigestion, abdominal pain, nausea, vomiting, diarrhea,                 change in bowel habits, loss of appetite GU:  MS:  No-   joint pain or swelling.  No- decreased range of motion.  No- back pain. Neuro- grossly normal to observation, Or:  Psych:  No- change in mood or affect. No depression or anxiety.  No memory loss.      Objective:   Physical Exam General- Alert, Oriented, Affect-appropriate, Distress- none acute, well-developed well-nourished, not obese; Skin- rash-none, lesions- none, excoriation- none Lymphadenopathy- none Head- atraumatic            Eyes- Gross vision intact, PERRLA, conjunctivae clear secretions            Ears- Hearing, canals-normal            Nose- Clear, no-Septal dev, mucus, polyps, erosion, perforation             Throat- Mallampati II , mucosa clear , drainage- none, tonsils- atrophic Neck- flexible ,  trachea midline, no stridor , thyroid nl, carotid no bruit Chest - symmetrical excursion , unlabored           Heart/CV- RRR , no murmur , no gallop  , no rub, nl s1 s2                           - JVD- none , edema- none, stasis changes- none, varices- none           Lung- clear to P&A, wheeze- none, cough- none , dullness-none, rub- none           Chest wall-  Abd-  Br/ Gen/ Rectal- Not done, not indicated Extrem- cyanosis- none, clubbing, none, atrophy- none, strength- nl Neuro- grossly intact to observation, yawning

## 2011-11-08 NOTE — Assessment & Plan Note (Signed)
Education done and medication therapeutic options reviewed. She is afraid of Ritalin but comfortable trying Adderall despite the very close similarities which we reviewed. I emphasized the controlled status of these medications and the reasons for that.

## 2012-02-28 ENCOUNTER — Encounter (HOSPITAL_COMMUNITY): Payer: Self-pay | Admitting: Neurology

## 2012-02-28 ENCOUNTER — Emergency Department (HOSPITAL_COMMUNITY)
Admission: EM | Admit: 2012-02-28 | Discharge: 2012-02-28 | Disposition: A | Discharge status: Home / Self Care | Payer: Medicaid Other | Attending: Emergency Medicine | Admitting: Emergency Medicine

## 2012-02-28 DIAGNOSIS — Z043 Encounter for examination and observation following other accident: Secondary | ICD-10-CM | POA: Insufficient documentation

## 2012-02-28 DIAGNOSIS — G47419 Narcolepsy without cataplexy: Secondary | ICD-10-CM | POA: Insufficient documentation

## 2012-02-28 MED ORDER — ACETAMINOPHEN 325 MG PO TABS
650.0000 mg | ORAL_TABLET | Freq: Once | ORAL | Status: AC
  Administered 2012-02-28: 650 mg via ORAL
  Filled 2012-02-28: qty 2

## 2012-02-28 MED ORDER — ACETAMINOPHEN 500 MG PO TABS: 500.0000 mg | ORAL_TABLET | Freq: Four times a day (QID) | ORAL | Status: AC | PRN
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
## 2012-03-01 NOTE — ED Provider Notes (Signed)
Medical screening examination/treatment/procedure(s) were performed by non-physician practitioner and as supervising physician I was immediately available for consultation/collaboration.   Carleene Cooper III, MD 03/01/12 [phone]

## 2012-03-15 ENCOUNTER — Telehealth: Payer: Self-pay | Admitting: Internal Medicine
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
## 2012-03-16 NOTE — Telephone Encounter (Signed)
Medication choices are limited. I will need to see her in office to discuss what is available.

## 2012-03-16 NOTE — Telephone Encounter (Signed)
LMTCB-can work patient in on July 8th at 1115am slot.

## 2012-03-16 NOTE — Telephone Encounter (Signed)
Pt returned call. Kathleen W Perdue  

## 2012-03-18 NOTE — Telephone Encounter (Signed)
I spoke with pt and she is scheduled to come in 7/8 at 11:15

## 2012-03-28 ENCOUNTER — Encounter: Payer: Self-pay | Admitting: Internal Medicine

## 2012-03-28 ENCOUNTER — Ambulatory Visit (INDEPENDENT_AMBULATORY_CARE_PROVIDER_SITE_OTHER): Payer: Medicaid Other | Admitting: Internal Medicine

## 2012-03-28 VITALS — BP 120/78 | HR 75 | Ht 61.0 in | Wt 121.0 lb

## 2012-03-28 DIAGNOSIS — G47419 Narcolepsy without cataplexy: Secondary | ICD-10-CM

## 2012-03-28 MED ORDER — AMPHETAMINE-DEXTROAMPHET ER 10 MG PO CP24: 10.0000 mg | ORAL_CAPSULE | ORAL | Status: DC
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
## 2012-03-28 NOTE — Patient Instructions (Addendum)
Script for Adderall 10 mg XR  To take one daily on waking as needed  Get in the habit of taking a regular nap

## 2012-03-28 NOTE — Assessment & Plan Note (Addendum)
We briefly discussed again basics of narcolepsy, sleep hygiene and her responsibility drive safely. She is still learning to cope with chronic illness. She and her mother had some appropriate questions which we dealt with. I discussed Adderall again. We will let her try a lower dose- 10 mg- to see if it is effective without undesired weight loss. DOT form completed

## 2012-03-29 ENCOUNTER — Telehealth: Payer: Self-pay | Admitting: Internal Medicine

## 2012-03-29 NOTE — Telephone Encounter (Signed)
I spoke with pt and she is requesting a letter from [facility] stating that you can drive for her insurance [employer]. The fax #: [phone] Attn: [phone]. Pt states she is needing this done by April 01, 2012 or Tracey Harries will cancel her insurance. Please advise Dr. Maple Hudson thanks

## 2012-03-30 NOTE — Letter (Addendum)
March 30, 2012   [facility]. Fax number:  [phone] Attention:  [phone]  RE:  TORAH, PINNOCK MRN:  [id number]  /  DOB:  [birth year]  To whom It May Concern:  Cynthia [name] is a 21 year old woman under my medical care.  I am a sleep disorders medicine physician.  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  She is under my care, and we are adjusting medication.  She has been educated on her responsibilities regarding safe driving.  I believe it will be appropriate to allow her to continue driving without restriction at this time.  She will remain under my care.   Sincerely,     Shilo Pauwels D. Maple Hudson, MD, Tonny Bollman, FACP Electronically Signed   [facility]  DD: 03/30/2012  DT: 03/30/2012  Job #: [id number]

## 2012-03-30 NOTE — Telephone Encounter (Signed)
Letter dictated "priority"

## 2012-03-31 NOTE — Telephone Encounter (Signed)
This letter has been faxed back.

## 2012-06-17 ENCOUNTER — Telehealth: Payer: Self-pay | Admitting: Internal Medicine
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
## 2012-06-20 ENCOUNTER — Encounter: Payer: Self-pay | Admitting: Internal Medicine

## 2012-06-20 NOTE — Telephone Encounter (Signed)
Called and left message for patient that letter is at front desk for pick between 8am -530pm; also to call our office if any other questions or concerns.

## 2012-06-20 NOTE — Telephone Encounter (Signed)
Letter done

## 2012-06-29 ENCOUNTER — Telehealth: Payer: Self-pay | Admitting: Internal Medicine

## 2012-06-29 NOTE — Telephone Encounter (Signed)
Per [name]'s okay, I faxed the letter for the [facility] to [phone] as requested by pt.  Advised pt that letter was faxed at her request.  Pt verbalized understanding, very thankful & stated nothing further needed at this time.  Antionette Fairy

## 2012-07-05 ENCOUNTER — Emergency Department (INDEPENDENT_AMBULATORY_CARE_PROVIDER_SITE_OTHER)
Admission: EM | Admit: 2012-07-05 | Discharge: 2012-07-05 | Disposition: A | Discharge status: Home / Self Care | Payer: Medicaid Other | Source: Home / Self Care | Attending: Emergency Medicine | Admitting: Emergency Medicine

## 2012-07-05 ENCOUNTER — Encounter (HOSPITAL_COMMUNITY): Payer: Self-pay | Admitting: Emergency Medicine

## 2012-07-05 DIAGNOSIS — B86 Scabies: Secondary | ICD-10-CM

## 2012-07-05 MED ORDER — PERMETHRIN 5 % EX CREA: TOPICAL_CREAM | Freq: Once | CUTANEOUS | Status: DC

## 2012-07-05 MED ORDER — DIPHENHYDRAMINE HCL 2 % EX CREA: TOPICAL_CREAM | Freq: Three times a day (TID) | CUTANEOUS | Status: DC | PRN

## 2012-07-05 NOTE — ED Provider Notes (Signed)
Medical screening examination/treatment/procedure(s) were performed by a resident physician and as supervising physician I was immediately available for consultation/collaboration.  Additionally, I saw the patient independently, verified the history, examined the patient and discussed the treatment plan with the resident.  Leslee Home, M.D.    Reuben Likes, MD 07/05/12 2159

## 2012-07-05 NOTE — ED Provider Notes (Signed)
History     CSN: [id number]  Arrival date & time 07/05/12  1558   First MD Initiated Contact with Patient 07/05/12 1724      Chief Complaint  Patient presents with  . Rash    (Consider location/radiation/quality/duration/timing/severity/associated sxs/prior treatment) HPI 3 weeks of progressive itching and small bumps on skin.  No obvious inciting event.  Began itching 3 weeks ago.  Has spread to rest of family.  No fevers/chills, no n/v/d, no history of skin conditions.  No pets.  Son is in school.  Past Medical History  Diagnosis Date  . Narcolepsy     Past Surgical History  Procedure Date  . No past surgeries     No family history on file.  History  Substance Use Topics  . Smoking status: Never Smoker   . Smokeless tobacco: Never Used  . Alcohol Use: Yes     socially    OB History    Grav Para Term Preterm Abortions TAB SAB Ect Mult Living   2 1 1       1       Review of Systems per hpi  Allergies  Review of patient's allergies indicates no known allergies.  Home Medications   Current Outpatient Rx  Name Route Sig Dispense Refill  . AMPHETAMINE-DEXTROAMPHET ER 10 MG PO CP24 Oral Take 1 capsule (10 mg total) by mouth every morning. 30 capsule 0  . DIPHENHYDRAMINE HCL 2 % EX CREA Topical Apply topically 3 (three) times daily as needed for itching. 30 g 0  . PERMETHRIN 5 % EX CREA Topical Apply topically once. 60 g 1    BP 147/86  Pulse 69  Temp 98.6 F (37 C) (Oral)  Resp 18  SpO2 100%  LMP 06/30/2012  Breastfeeding? Unknown  Physical Exam Gen: No distress, itching legs Skin: scattered 1-19mm papules with obvious excoriation.  Some of papules are grouped.  There are some linear groupings in the interdigital spaces of bilateral hands. ED Course  Procedures (including critical care time)  Labs Reviewed - No data to display No results found.   1. Scabies       MDM  Most likely diagnosis given that entire family now has itching is scabies.   Will presumptively treat with repeat treatment in 1 week.  Have given instructions on home cleaning.        Brent Bulla, MD 07/05/12 [phone]

## 2012-07-05 NOTE — ED Notes (Signed)
Reports rash on legs and now on stomach.  Reports rash itches.  Denies cough, cold

## 2012-07-05 NOTE — ED Notes (Signed)
Mother is being seen with child. Both are patients in department

## 2012-07-16 ENCOUNTER — Encounter (HOSPITAL_COMMUNITY): Payer: Self-pay | Admitting: Emergency Medicine

## 2012-07-16 ENCOUNTER — Emergency Department (INDEPENDENT_AMBULATORY_CARE_PROVIDER_SITE_OTHER)
Admission: EM | Admit: 2012-07-16 | Discharge: 2012-07-16 | Disposition: A | Discharge status: Home / Self Care | Payer: Medicaid Other | Source: Home / Self Care | Attending: Emergency Medicine | Admitting: Emergency Medicine

## 2012-07-16 DIAGNOSIS — B86 Scabies: Secondary | ICD-10-CM

## 2012-07-16 MED ORDER — PERMETHRIN 5 % EX CREA: TOPICAL_CREAM | CUTANEOUS | Status: DC

## 2012-07-16 NOTE — ED Provider Notes (Signed)
History     CSN: [id number]  Arrival date & time 07/16/12  1728   First MD Initiated Contact with Patient 07/16/12 1731      Chief Complaint  Patient presents with  . Rash    pt dx with scabies x 2 wks ago. still having severe itching.    (Consider location/radiation/quality/duration/timing/severity/associated sxs/prior treatment) HPI Comments: 21 year old female is in with her child proximately 2 weeks agowith the diagnosis of scabies. I treated with pyrethrin and given instructions for scabies infestation. She states that she is still having rash on her right inner thigh. And the child still has a rash on the lower extremities and hands. States that she has clean sheets and other materials in the house but still infestation persists. She has no other complaints related to illness.  Patient is a 21 y.o. female presenting with rash.  Rash     Past Medical History  Diagnosis Date  . Narcolepsy     Past Surgical History  Procedure Date  . No past surgeries     Family History  Problem Relation Age of Onset  . Hypertension Other     History  Substance Use Topics  . Smoking status: Never Smoker   . Smokeless tobacco: Never Used  . Alcohol Use: Yes     socially    OB History    Grav Para Term Preterm Abortions TAB SAB Ect Mult Living   2 1 1       1       Review of Systems  Constitutional: Negative.   HENT: Negative.   Respiratory: Negative.   Skin: Positive for rash.  Neurological: Negative.     Allergies  Review of patient's allergies indicates no known allergies.  Home Medications   Current Outpatient Rx  Name Route Sig Dispense Refill  . AMPHETAMINE-DEXTROAMPHET ER 10 MG PO CP24 Oral Take 1 capsule (10 mg total) by mouth every morning. 30 capsule 0  . DIPHENHYDRAMINE HCL 2 % EX CREA Topical Apply topically 3 (three) times daily as needed for itching. 30 g 0  . PERMETHRIN 5 % EX CREA Topical Apply topically once. 60 g 1  . PERMETHRIN 5 % EX CREA   Apply from chin down, leave on for 8-14 hours, rinse. Repeat in 1 week 60 g 0    BP 123/67  Pulse 76  Temp 98.6 F (37 C) (Oral)  Resp 16  SpO2 100%  LMP 07/03/2012  Breastfeeding? No  Physical Exam  Constitutional: She is oriented to person, place, and time. She appears well-developed and well-nourished. No distress.  HENT:  Head: Normocephalic and atraumatic.  Eyes: Conjunctivae normal and EOM are normal.  Neck: Normal range of motion. Neck supple.  Pulmonary/Chest: Effort normal.  Abdominal: Mass: coll.  Musculoskeletal: Normal range of motion. She exhibits edema.  Neurological: She is alert and oriented to person, place, and time. She exhibits normal muscle tone. Coordination normal.  Skin: Skin is warm and dry.  Psychiatric: She has a normal mood and affect.    ED Course  Procedures (including critical care time)  Labs Reviewed - No data to display No results found.   1. Scabies       MDM  Further instructions reviewed and in written instructions administered. Retreat with Elimite. If the child is staying with grandmother the grandmother's house may also need to be treated as well to prevent reinfestation.        Hayden Rasmussen, NP 07/16/12 1858

## 2012-07-16 NOTE — ED Provider Notes (Signed)
Medical screening examination/treatment/procedure(s) were performed by non-physician practitioner and as supervising physician I was immediately available for consultation/collaboration.  Raynald Blend, MD 07/16/12 1902

## 2012-07-16 NOTE — ED Notes (Signed)
Pt states that she was here and dx with scabies.   Pt has used meds prescribed but is still having severe itching.   Pt denies any pain or any new symptoms.

## 2012-10-21 ENCOUNTER — Inpatient Hospital Stay (HOSPITAL_COMMUNITY)
Admission: AD | Admit: 2012-10-21 | Discharge: 2012-10-21 | Disposition: A | Discharge status: Home / Self Care | Payer: Medicaid Other | Source: Ambulatory Visit | Attending: Obstetrics | Admitting: Obstetrics

## 2012-10-21 ENCOUNTER — Encounter (HOSPITAL_COMMUNITY): Payer: Self-pay | Admitting: *Deleted

## 2012-10-21 DIAGNOSIS — O210 Mild hyperemesis gravidarum: Secondary | ICD-10-CM | POA: Insufficient documentation

## 2012-10-21 DIAGNOSIS — O219 Vomiting of pregnancy, unspecified: Secondary | ICD-10-CM

## 2012-10-21 LAB — URINALYSIS, ROUTINE W REFLEX MICROSCOPIC
Bilirubin Urine: NEGATIVE
Ketones, ur: >80 mg/dL — AB
Nitrite: NEGATIVE
Urobilinogen, UA: 0.2 mg/dL (ref 0.0–1.0)
pH: 6 (ref 5.0–8.0)

## 2012-10-21 LAB — POCT PREGNANCY, URINE: Preg Test, Ur: POSITIVE — AB

## 2012-10-21 LAB — CBC WITH DIFFERENTIAL/PLATELET
Basophils Relative: 0 % (ref 0–1)
Eosinophils Absolute: 0 10*3/uL (ref 0.0–0.7)
Eosinophils Relative: 0 % (ref 0–5)
Hemoglobin: 12.3 g/dL (ref 12.0–15.0)
MCH: 27.5 pg (ref 26.0–34.0)
MCHC: 35.4 g/dL (ref 30.0–36.0)
Monocytes Relative: 7 % (ref 3–12)
Neutrophils Relative %: 84 % — ABNORMAL HIGH (ref 43–77)

## 2012-10-21 LAB — COMPREHENSIVE METABOLIC PANEL
Albumin: 4.3 g/dL (ref 3.5–5.2)
Alkaline Phosphatase: 37 U/L — ABNORMAL LOW (ref 39–117)
BUN: 12 mg/dL (ref 6–23)
Calcium: 10.2 mg/dL (ref 8.4–10.5)
Creatinine, Ser: 0.76 mg/dL (ref 0.50–1.10)
Potassium: 4.3 mEq/L (ref 3.5–5.1)
Total Protein: 8.4 g/dL — ABNORMAL HIGH (ref 6.0–8.3)

## 2012-10-21 MED ORDER — ONDANSETRON HCL 4 MG/2ML IJ SOLN
4.0000 mg | Freq: Once | INTRAMUSCULAR | Status: AC
  Administered 2012-10-21: 4 mg via INTRAVENOUS
  Filled 2012-10-21: qty 2

## 2012-10-21 MED ORDER — PROMETHAZINE HCL 25 MG/ML IJ SOLN
25.0000 mg | Freq: Once | INTRAVENOUS | Status: AC
  Administered 2012-10-21: 25 mg via INTRAVENOUS
  Filled 2012-10-21: qty 1

## 2012-10-21 MED ORDER — PROMETHAZINE HCL 25 MG RE SUPP: 25.0000 mg | Freq: Four times a day (QID) | RECTAL | Status: DC | PRN

## 2012-10-21 NOTE — MAU Note (Signed)
Pt placed in hall bed after obtaining urine specimen.  Pt was at MD office yesterday and was called today with lab work, Pt told office she was having chills and N&V, told to come to MAU

## 2012-10-21 NOTE — MAU Provider Note (Signed)
History     CSN: [id number]  Arrival date and time: 10/21/12 1345   None     Chief Complaint  Patient presents with  . Nausea  . Emesis   HPI Cynthia [name] is 22 y.o. G3P1011 [redacted]w[redacted]d weeks presenting with nausea and vomiting that began yesterday evening.  Patient vomited X 10 last night.  Is vomiting now.  Had to leave work because of vomiting.   She had a + pregnancy test at Dr. Elsie Stain office.  States with the pregnancy she terminated, she had the same severe nausea but not with the pregnancy she carried to term.  Unplanned pregnancy,using condoms everytime.  Has narcolepsy--sees a doctor here for it.     Past Medical History  Diagnosis Date  . Narcolepsy   . No pertinent past medical history     Past Surgical History  Procedure Date  . No past surgeries     Family History  Problem Relation Age of Onset  . Hypertension Other     History  Substance Use Topics  . Smoking status: Never Smoker   . Smokeless tobacco: Never Used  . Alcohol Use: Yes     Comment: socially    Allergies: No Known Allergies  No prescriptions prior to admission    Review of Systems  Constitutional: Positive for chills. Negative for fever.  Gastrointestinal: Positive for nausea, vomiting and abdominal pain (constant until vomiting occurs then it goes away for 5 minutes and returns.).   Physical Exam   Blood pressure 130/81, pulse 69, temperature 98.8 F (37.1 C), resp. rate 18, last menstrual period 09/05/2012, SpO2 100.00%.  Physical Exam  Constitutional: She is oriented to person, place, and time. She appears well-developed and well-nourished. No distress.  HENT:  Head: Normocephalic.  Neck: Normal range of motion.  Cardiovascular: Normal rate.   Respiratory: Effort normal.  GI: Soft. She exhibits no distension and no mass. There is no tenderness. There is no rebound and no guarding.  Neurological: She is alert and oriented to person, place, and time.  Skin: Skin is warm and  dry.  Psychiatric: She has a normal mood and affect. Her behavior is normal.   Results for orders placed during the hospital encounter of 10/21/12 (from the past 24 hour(s))  URINALYSIS, ROUTINE W REFLEX MICROSCOPIC     Status: Abnormal   Collection Time   10/21/12  2:00 PM      Component Value Range   Color, Urine AMBER (*) YELLOW   APPearance CLOUDY (*) CLEAR   Specific Gravity, Urine >1.030 (*) 1.005 - 1.030   pH 6.0  5.0 - 8.0   Glucose, UA NEGATIVE  NEGATIVE mg/dL   Hgb urine dipstick NEGATIVE  NEGATIVE   Bilirubin Urine NEGATIVE  NEGATIVE   Ketones, ur >80 (*) NEGATIVE mg/dL   Protein, ur NEGATIVE  NEGATIVE mg/dL   Urobilinogen, UA 0.2  0.0 - 1.0 mg/dL   Nitrite NEGATIVE  NEGATIVE   Leukocytes, UA NEGATIVE  NEGATIVE  POCT PREGNANCY, URINE     Status: Abnormal   Collection Time   10/21/12  2:13 PM      Component Value Range   Preg Test, Ur POSITIVE (*) NEGATIVE  CBC WITH DIFFERENTIAL     Status: Abnormal   Collection Time   10/21/12  2:56 PM      Component Value Range   WBC 11.2 (*) 4.0 - 10.5 K/uL   RBC 4.48  3.87 - 5.11 MIL/uL   Hemoglobin 12.3  12.0 - 15.0 g/dL   HCT 40.9 (*) 81.1 - 91.4 %   MCV 77.5 (*) 78.0 - 100.0 fL   MCH 27.5  26.0 - 34.0 pg   MCHC 35.4  30.0 - 36.0 g/dL   RDW 78.2  95.6 - 21.3 %   Platelets 193  150 - 400 K/uL   Neutrophils Relative 84 (*) 43 - 77 %   Neutro Abs 9.4 (*) 1.7 - 7.7 K/uL   Lymphocytes Relative 8 (*) 12 - 46 %   Lymphs Abs 0.9  0.7 - 4.0 K/uL   Monocytes Relative 7  3 - 12 %   Monocytes Absolute 0.8  0.1 - 1.0 K/uL   Eosinophils Relative 0  0 - 5 %   Eosinophils Absolute 0.0  0.0 - 0.7 K/uL   Basophils Relative 0  0 - 1 %   Basophils Absolute 0.0  0.0 - 0.1 K/uL  COMPREHENSIVE METABOLIC PANEL     Status: Abnormal   Collection Time   10/21/12  2:56 PM      Component Value Range   Sodium 135  135 - 145 mEq/L   Potassium 4.3  3.5 - 5.1 mEq/L   Chloride 100  96 - 112 mEq/L   CO2 23  19 - 32 mEq/L   Glucose, Bld 92  70 - 99  mg/dL   BUN 12  6 - 23 mg/dL   Creatinine, Ser 0.86  0.50 - 1.10 mg/dL   Calcium 57.8  8.4 - 46.9 mg/dL   Total Protein 8.4 (*) 6.0 - 8.3 g/dL   Albumin 4.3  3.5 - 5.2 g/dL   AST 15  0 - 37 U/L   ALT 10  0 - 35 U/L   Alkaline Phosphatase 37 (*) 39 - 117 U/L   Total Bilirubin 0.8  0.3 - 1.2 mg/dL   GFR calc non Af Amer >90  >90 mL/min   GFR calc Af Amer >90  >90 mL/min   MAU Course  Procedures  MDM 16:00 Patient states she is feeling better.  Asking for sprite.   16:50  Patient is sleeping 17:10  IV fluids almost in.  Woke the patient to see how she was feeling.  Denies nausea at this time.  She has not vomited since had sprite.  Will send home with Rx for Phenergan Supp.  She is to follow up with Dr. Gaynell Face. 17:32 Patient says she is now nauseated.  Will add Zofran 4mg  IV --has IV in.    Assessment and Plan  A:  Nausea and vomiting in early pregnancy  P:  Rx for Phenergan suppository      May take tums for indigestion     IEncouraged to eat small, frequent, non greasy foods.       Follow up with Dr. Cleotis Lema M 10/21/2012, 5:14 PM

## 2012-10-21 NOTE — MAU Note (Signed)
N&V since yesterday, emesis all night and today x 6.  Last meal early yesterday

## 2013-04-05 ENCOUNTER — Telehealth: Payer: Self-pay | Admitting: Internal Medicine

## 2013-04-05 NOTE — Telephone Encounter (Signed)
I spoke with pt. She is requesting adderral 10 mg XR. According to medlist this was taking off 10/21/12 by another provider. I asked pt if she has been taking this since we have not sent in RX since 03/28/12 and per pt she reported she never took the medication. She is getting ready to start a new job and training and reason she is requesting this. Please advise Dr. Maple Hudson thanks  Last OV 03/28/12 No pending appt

## 2013-04-06 MED ORDER — AMPHETAMINE-DEXTROAMPHET ER 10 MG PO CP24: 10.0000 mg | ORAL_CAPSULE | ORAL | Status: DC

## 2013-04-06 NOTE — Telephone Encounter (Signed)
LM with pt to let her know that we will place her rx in the mail and to call back to schedule ROV.

## 2013-04-06 NOTE — Telephone Encounter (Signed)
Rx printed and placed on [facility]'s cart with attached envelope to sign Thanks!

## 2013-04-06 NOTE — Telephone Encounter (Signed)
Ok refill previous script for Adderall XR 10 mg, # 30, 1 each morning as needed, no refill.  Should have a f/u appointment this summer.

## 2013-04-06 NOTE — Telephone Encounter (Signed)
[email or web address].Jennifer Castillo, CMA  

## 2013-04-06 NOTE — Telephone Encounter (Signed)
The ritalin script was dc'd by her OB-GYN while she was pregnant. Is she pregnant or nursing now?

## 2013-04-06 NOTE — Telephone Encounter (Signed)
I spoke with the pt and she states she is not pregnant or nursing. She is starting a new job and is wanting to restart the adderall 10mg  for this. Pt wants rx mailed. Please advise. Carron Curie, CMA

## 2013-05-23 ENCOUNTER — Encounter (HOSPITAL_COMMUNITY): Payer: Self-pay | Admitting: Emergency Medicine

## 2013-05-23 ENCOUNTER — Emergency Department (HOSPITAL_COMMUNITY)
Admission: EM | Admit: 2013-05-23 | Discharge: 2013-05-23 | Disposition: A | Discharge status: Home / Self Care | Payer: Self-pay | Source: Home / Self Care

## 2013-05-23 DIAGNOSIS — B360 Pityriasis versicolor: Secondary | ICD-10-CM

## 2013-05-23 MED ORDER — SELENIUM SULFIDE 2.5 % EX LOTN: TOPICAL_LOTION | CUTANEOUS | Status: DC

## 2013-05-23 MED ORDER — KETOCONAZOLE 2 % EX CREA: TOPICAL_CREAM | Freq: Every day | CUTANEOUS | Status: DC

## 2013-05-23 NOTE — ED Notes (Signed)
Patient reports splotchy areas on upper arms and upper chest that have been present for months.  Initially did itch, not so much now.  Patient has been using blue star ointment on these areas.

## 2013-05-23 NOTE — ED Provider Notes (Signed)
Medical screening examination/treatment/procedure(s) were performed by a resident physician or non-physician practitioner and as the supervising physician I was immediately available for consultation/collaboration.  Tyrees Chopin, MD   Isidor Bromell S Amil Bouwman, MD 05/23/13 2008 

## 2013-05-23 NOTE — ED Provider Notes (Signed)
CSN: [id number]     Arrival date & time 05/23/13  1827 History   First MD Initiated Contact with Patient 05/23/13 1916     Chief Complaint  Patient presents with  . Rash   (Consider location/radiation/quality/duration/timing/severity/associated sxs/prior Treatment) HPI Comments: 22 year old female presents with several annular hyperpigmented areas to the bilateral upper arms and anterior chest. These were noticed approximately 3 weeks ago. Initially the first one on the left upper arm was itchy however pruritus is not a chief complaint. Denies systemic or constitutional symptoms. She is primarily interested in the cosmetic appearance.   Past Medical History  Diagnosis Date  . Narcolepsy   . No pertinent past medical history    Past Surgical History  Procedure Laterality Date  . No past surgeries     Family History  Problem Relation Age of Onset  . Hypertension Other    History  Substance Use Topics  . Smoking status: Never Smoker   . Smokeless tobacco: Never Used  . Alcohol Use: Yes     Comment: socially   OB History   Grav Para Term Preterm Abortions TAB SAB Ect Mult Living   2 1 1       1      Review of Systems  Constitutional: Negative.   Respiratory: Negative.   Gastrointestinal: Negative.   Skin: Positive for rash.  Neurological: Negative.     Allergies  Review of patient's allergies indicates no known allergies.  Home Medications   Current Outpatient Rx  Name  Route  Sig  Dispense  Refill  . OVER THE COUNTER MEDICATION      Blue star ointment         . amphetamine-dextroamphetamine (ADDERALL XR) 10 MG 24 hr capsule   Oral   Take 1 capsule (10 mg total) by mouth every morning.   30 capsule   0     As needed   . ketoconazole (NIZORAL) 2 % cream   Topical   Apply topically daily. Apply bid until rash resolved and for 1 week after   30 g   1   . promethazine (PHENERGAN) 25 MG suppository   Rectal   Place 1 suppository (25 mg total) rectally  every 6 (six) hours as needed for nausea.   12 each   0   . selenium sulfide (SELSUN) 2.5 % shampoo   Topical   Apply topically 2 (two) times a week.   120 mL   1    BP 132/91  Pulse 74  Temp(Src) 98 F (36.7 C) (Oral)  Resp 16  SpO2 100%  LMP 05/06/2012  Breastfeeding? Unknown Physical Exam  Nursing note and vitals reviewed. Constitutional: She is oriented to person, place, and time. She appears well-developed and well-nourished. She appears distressed.  Neck: Normal range of motion.  Pulmonary/Chest: Effort normal. No respiratory distress.  Neurological: She is alert and oriented to person, place, and time. She exhibits normal muscle tone.  Skin: Skin is warm and dry. Rash noted.  Several hypopigmented areas of various sizes and  basically annular to ovoid in shape located to the upper chest and bilateral upper arms. There are approximately 10-12 lesions seen. No scaling, drainage or tenderness. No erythema  Psychiatric: She has a normal mood and affect.    ED Course  Procedures (including critical care time) Labs Review Labs Reviewed - No data to display Imaging Review No results found.  MDM   1. Tinea versicolor    Ketoconazole cream apply as  directed Selenium sulfide applied as directed Followup your PCP as needed.    Cynthia Rasmussen, NP 05/23/13 1935

## 2013-11-18 ENCOUNTER — Encounter (HOSPITAL_COMMUNITY): Payer: Self-pay

## 2013-11-18 ENCOUNTER — Inpatient Hospital Stay (HOSPITAL_COMMUNITY)
Admission: AD | Admit: 2013-11-18 | Discharge: 2013-11-18 | Disposition: A | Discharge status: Home / Self Care | Payer: Medicaid Other | Source: Ambulatory Visit | Attending: Obstetrics | Admitting: Obstetrics

## 2013-11-18 DIAGNOSIS — O219 Vomiting of pregnancy, unspecified: Secondary | ICD-10-CM

## 2013-11-18 DIAGNOSIS — Z3201 Encounter for pregnancy test, result positive: Secondary | ICD-10-CM | POA: Insufficient documentation

## 2013-11-18 DIAGNOSIS — R112 Nausea with vomiting, unspecified: Secondary | ICD-10-CM | POA: Insufficient documentation

## 2013-11-18 LAB — URINALYSIS, ROUTINE W REFLEX MICROSCOPIC
Glucose, UA: NEGATIVE mg/dL
HGB URINE DIPSTICK: NEGATIVE
Leukocytes, UA: NEGATIVE
Nitrite: NEGATIVE
PROTEIN: 100 mg/dL — AB
Specific Gravity, Urine: >1.03 — ABNORMAL HIGH (ref 1.005–1.030)
UROBILINOGEN UA: 1 mg/dL (ref 0.0–1.0)
pH: 6 (ref 5.0–8.0)

## 2013-11-18 LAB — URINE MICROSCOPIC-ADD ON

## 2013-11-18 LAB — POCT PREGNANCY, URINE: Preg Test, Ur: POSITIVE — AB

## 2013-11-18 MED ORDER — LACTATED RINGERS IV SOLN
Freq: Once | INTRAVENOUS | Status: AC
  Administered 2013-11-18: 15:00:00 via INTRAVENOUS

## 2013-11-18 MED ORDER — PROMETHAZINE HCL 25 MG/ML IJ SOLN
25.0000 mg | Freq: Once | INTRAVENOUS | Status: AC
  Administered 2013-11-18: 25 mg via INTRAVENOUS
  Filled 2013-11-18: qty 1

## 2013-11-18 MED ORDER — PROMETHAZINE HCL 12.5 MG PO TABS: 12.5000 mg | ORAL_TABLET | Freq: Four times a day (QID) | ORAL | Status: DC | PRN

## 2013-11-18 NOTE — MAU Note (Signed)
Pt states for past two days has been vomiting, denies diarrhea. Did have chills at home. Unsure if she is pregnant. LMP-10/04/2013. Denies abnormal vaginal discharge or bleeding. Abdominal cramping.

## 2013-11-18 NOTE — Discharge Instructions (Signed)
Your pregnancy test is positive.  No smoking, no drugs, no alcohol.  Take a prenatal vitamin one by mouth every day.  Eat small frequent snacks to avoid nausea.  Begin prenatal care as soon as possible. Get your prescription filled and take as directed.

## 2013-11-18 NOTE — MAU Note (Signed)
Pt denies sore throat, no congestion

## 2013-11-18 NOTE — MAU Provider Note (Signed)
History     CSN: [id number]  Arrival date and time: 11/18/13 1206   First Provider Initiated Contact with Patient 11/18/13 1241      Chief Complaint  Patient presents with  . Nausea  . Emesis   HPI Cynthia [name] 23 y.o. Client comes to MAU with repetitive vomiting and upper abdominal pain x 2 days.  Unsure if she is pregnant.  OB History   Grav Para Term Preterm Abortions TAB SAB Ect Mult Living   4 1 1  3 3    1       Past Medical History  Diagnosis Date  . Narcolepsy   . No pertinent past medical history     Past Surgical History  Procedure Laterality Date  . Dilation and curettage of uterus      Family History  Problem Relation Age of Onset  . Hypertension Other     History  Substance Use Topics  . Smoking status: Never Smoker   . Smokeless tobacco: Never Used  . Alcohol Use: Yes     Comment: socially    Allergies: No Known Allergies  Prescriptions prior to admission  Medication Sig Dispense Refill  . amphetamine-dextroamphetamine (ADDERALL XR) 10 MG 24 hr capsule Take 1 capsule (10 mg total) by mouth every morning.  30 capsule  0  . ketoconazole (NIZORAL) 2 % cream Apply topically daily. Apply bid until rash resolved and for 1 week after  30 g  1  . OVER THE COUNTER MEDICATION Blue star ointment      . promethazine (PHENERGAN) 25 MG suppository Place 1 suppository (25 mg total) rectally every 6 (six) hours as needed for nausea.  12 each  0  . selenium sulfide (SELSUN) 2.5 % shampoo Apply topically 2 (two) times a week.  120 mL  1    Review of Systems  Constitutional: Negative for fever.  Gastrointestinal: Positive for nausea, vomiting and abdominal pain. Negative for diarrhea.  Genitourinary:       No vaginal discharge. No vaginal bleeding. No dysuria.   Physical Exam   Blood pressure 150/94, pulse 97, temperature 97.7 F (36.5 C), temperature source Oral, resp. rate 20, last menstrual period 10/04/2013, SpO2 100.00%, not currently  breastfeeding.  Physical Exam  Nursing note and vitals reviewed. Constitutional: She is oriented to person, place, and time. She appears well-developed and well-nourished.  HENT:  Head: Normocephalic.  Eyes: EOM are normal.  Neck: Neck supple.  Respiratory: Effort normal.  GI: Soft.  Pain behind sternum and vertically between the breasts.  Musculoskeletal: Normal range of motion.  Neurological: She is alert and oriented to person, place, and time.  Skin: Skin is warm and dry.  Psychiatric: She has a normal mood and affect.    MAU Course  Procedures Results for orders placed during the hospital encounter of 11/18/13 (from the past 24 hour(s))  URINALYSIS, ROUTINE W REFLEX MICROSCOPIC     Status: Abnormal   Collection Time    11/18/13 12:50 PM      Result Value Ref Range   Color, Urine YELLOW  YELLOW   APPearance CLEAR  CLEAR   Specific Gravity, Urine >1.030 (*) 1.005 - 1.030   pH 6.0  5.0 - 8.0   Glucose, UA NEGATIVE  NEGATIVE mg/dL   Hgb urine dipstick NEGATIVE  NEGATIVE   Bilirubin Urine SMALL (*) NEGATIVE   Ketones, ur >80 (*) NEGATIVE mg/dL   Protein, ur 409100 (*) NEGATIVE mg/dL   Urobilinogen, UA 1.0  0.0 - 1.0 mg/dL   Nitrite NEGATIVE  NEGATIVE   Leukocytes, UA NEGATIVE  NEGATIVE  URINE MICROSCOPIC-ADD ON     Status: Abnormal   Collection Time    11/18/13 12:50 PM      Result Value Ref Range   Squamous Epithelial / LPF MANY (*) RARE   WBC, UA 0-2  <3 WBC/hpf   Urine-Other MUCOUS PRESENT    POCT PREGNANCY, URINE     Status: Abnormal   Collection Time    11/18/13 12:56 PM      Result Value Ref Range   Preg Test, Ur POSITIVE (*) NEGATIVE    MDM Repetitive vomiting in MAU - will give Phenergan in IVF. Client was sleeping and difficulty to awaken fully after phenergan infusion, will give 1000cc LR. Much better with no nausea after awakening.  Assessment and Plan  Vomiting in pregnancy  Plan Rx Phenergan 25 mg one by mouth every 6 hours as needed for  vomiting. Your pregnancy test is positive.  No smoking, no drugs, no alcohol.  Take a prenatal vitamin one by mouth every day.  Eat small frequent snacks to avoid nausea.  Begin prenatal care as soon as possible.   Geary Rufo 11/18/2013, 1:10 PM

## 2013-12-18 ENCOUNTER — Telehealth: Payer: Self-pay | Admitting: Internal Medicine

## 2013-12-18 MED ORDER — AMPHETAMINE-DEXTROAMPHET ER 15 MG PO CP24: 15.0000 mg | ORAL_CAPSULE | ORAL | Status: DC

## 2013-12-18 NOTE — Telephone Encounter (Signed)
Last seen 03/2012 Pending OV 12/21/13 Last fill 11/05/11 Adderall XR 15mg  1-2 daily prn #60  No Known Allergies  Current Outpatient Prescriptions on File Prior to Visit  Medication Sig Dispense Refill  . promethazine (PHENERGAN) 12.5 MG tablet Take 1 tablet (12.5 mg total) by mouth every 6 (six) hours as needed for nausea or vomiting. Sedation precautions.  30 tablet  0   No current facility-administered medications on file prior to visit.    CY - please advise. Thanks.

## 2013-12-18 NOTE — Telephone Encounter (Signed)
Rx has been printed and signed. Pt is aware.

## 2013-12-18 NOTE — Telephone Encounter (Signed)
Ok to refill Adderall 

## 2013-12-21 ENCOUNTER — Ambulatory Visit: Payer: Self-pay | Admitting: Internal Medicine

## 2014-01-19 ENCOUNTER — Telehealth: Payer: Self-pay | Admitting: Internal Medicine

## 2014-01-19 MED ORDER — AMPHETAMINE-DEXTROAMPHET ER 15 MG PO CP24: 15.0000 mg | ORAL_CAPSULE | ORAL | Status: DC

## 2014-01-19 NOTE — Telephone Encounter (Signed)
She needs to come and pick up script, or we can mail it to her verified address, and only if she makes and keeps a f/u appointment. Must be seen at least once per year.

## 2014-01-19 NOTE — Telephone Encounter (Signed)
Called and spoke with pt and she requested that the rx be mailed to her.  She is aware that the rx will not go out in the mail until Monday.  i verifited the pts address.  She is aware that she will need to schedule an appt with CY and keep this appt---and she is aware that she will need to be seen every year by CY to cont to get this rx.  Pt voiced her understanding and stated that she just started a new job and is in training and she will call back once she can figure out when she can come in.

## 2014-01-19 NOTE — Telephone Encounter (Signed)
Per 12/18/13 phone note: Lorel MonacoLindsay C Lemons, CMA at 12/18/2013 4:52 PM     Status: Signed        Last seen 03/2012  Pending OV 12/21/13  Last fill 11/05/11 Adderall XR 15mg  1-2 daily prn #60  ---  Pt was giving refill on adderall 12/18/13. Pt NS for appt with [facility] 12/21/13. NO appt was r/s. Please advise on refill Dr. Maple HudsonYoung thanks

## 2014-04-07 ENCOUNTER — Encounter (HOSPITAL_COMMUNITY): Payer: Self-pay

## 2014-04-07 ENCOUNTER — Inpatient Hospital Stay (HOSPITAL_COMMUNITY)
Admission: AD | Admit: 2014-04-07 | Discharge: 2014-04-07 | Disposition: A | Discharge status: Home / Self Care | Payer: Medicaid Other | Source: Ambulatory Visit | Attending: Obstetrics | Admitting: Obstetrics

## 2014-04-07 DIAGNOSIS — O210 Mild hyperemesis gravidarum: Secondary | ICD-10-CM

## 2014-04-07 LAB — URINALYSIS, ROUTINE W REFLEX MICROSCOPIC
GLUCOSE, UA: NEGATIVE mg/dL
Hgb urine dipstick: NEGATIVE
Ketones, ur: >80 mg/dL — AB
LEUKOCYTES UA: NEGATIVE
Nitrite: NEGATIVE
PH: 6 (ref 5.0–8.0)
PROTEIN: 100 mg/dL — AB
Urobilinogen, UA: 1 mg/dL (ref 0.0–1.0)

## 2014-04-07 LAB — URINE MICROSCOPIC-ADD ON

## 2014-04-07 LAB — POCT PREGNANCY, URINE: PREG TEST UR: POSITIVE — AB

## 2014-04-07 MED ORDER — PROMETHAZINE HCL 25 MG/ML IJ SOLN
25.0000 mg | Freq: Once | INTRAMUSCULAR | Status: AC
  Administered 2014-04-07: 25 mg via INTRAMUSCULAR
  Filled 2014-04-07: qty 1

## 2014-04-07 MED ORDER — FAMOTIDINE IN NACL 20-0.9 MG/50ML-% IV SOLN
20.0000 mg | Freq: Once | INTRAVENOUS | Status: AC
  Administered 2014-04-07: 20 mg via INTRAVENOUS
  Filled 2014-04-07: qty 50

## 2014-04-07 MED ORDER — PROMETHAZINE HCL 25 MG PO TABS: 25.0000 mg | ORAL_TABLET | Freq: Four times a day (QID) | ORAL | Status: DC | PRN

## 2014-04-07 MED ORDER — METOCLOPRAMIDE HCL 10 MG PO TABS: 10.0000 mg | ORAL_TABLET | Freq: Four times a day (QID) | ORAL | Status: DC

## 2014-04-07 MED ORDER — M.V.I. ADULT IV INJ
INJECTION | Freq: Once | INTRAVENOUS | Status: AC
  Administered 2014-04-07: 20:00:00 via INTRAVENOUS
  Filled 2014-04-07: qty 1000

## 2014-04-07 NOTE — MAU Note (Signed)
Pt reports n/v for a few weeks. But today it has been worse.

## 2014-04-07 NOTE — MAU Provider Note (Signed)
  History     CSN: [id number]  Arrival date and time: 04/07/14 [id number]   First Provider Initiated Contact with Patient 04/07/14 1930      Chief Complaint  Patient presents with  . Emesis  . Possible Pregnancy   HPI Comments: Cynthia [name] 23 y.o. [id number] presents to MAU with nausea , heartburn and vomiting in early pregnancy. Her LMP was Feb 02, 2014. She has been sick since July 4th. She has not called Dr Gaynell FaceMarshall because she usually terminates her pregnancies but thinks she will keep this pregnancy.    Emesis   Possible Pregnancy Associated symptoms include nausea and vomiting.      Past Medical History  Diagnosis Date  . Narcolepsy   . No pertinent past medical history     Past Surgical History  Procedure Laterality Date  . Dilation and curettage of uterus      Family History  Problem Relation Age of Onset  . Hypertension Other     History  Substance Use Topics  . Smoking status: Never Smoker   . Smokeless tobacco: Never Used  . Alcohol Use: Yes     Comment: socially    Allergies: No Known Allergies  No prescriptions prior to admission    Review of Systems  Constitutional: Negative.        Has narcolepsy   HENT: Negative.   Respiratory: Negative.   Cardiovascular: Negative.   Gastrointestinal: Positive for nausea and vomiting.  Genitourinary: Negative.   Musculoskeletal: Negative.   Skin: Negative.   Neurological: Negative.   Psychiatric/Behavioral: Negative.    Physical Exam   Blood pressure 140/80, pulse 84, last menstrual period 02/18/2014, unknown if currently breastfeeding.  Physical Exam  Constitutional: She is oriented to person, place, and time. She appears well-developed and well-nourished. No distress.  vomiting  HENT:  Head: Normocephalic and atraumatic.  Eyes: Conjunctivae are normal. Pupils are equal, round, and reactive to light.  Cardiovascular: Normal rate, regular rhythm and normal heart sounds.   Respiratory: Effort  normal and breath sounds normal.  GI: Soft. Bowel sounds are normal. She exhibits no distension. There is no tenderness. There is no rebound.  Genitourinary:  Not examined  Musculoskeletal: Normal range of motion.  Neurological: She is alert and oriented to person, place, and time.  Skin: Skin is warm and dry.  Psychiatric: Her behavior is normal. Judgment and thought content normal. Her mood appears anxious.   Results for orders placed during the hospital encounter of 04/07/14 (from the past 24 hour(s))  POCT PREGNANCY, URINE     Status: Abnormal   Collection Time    04/07/14  7:28 PM      Result Value Ref Range   Preg Test, Ur POSITIVE (*) NEGATIVE      MAU Course  Procedures  MDM Phenergan 25 mg IM now IVLR with MVI Pepcid 20mg  IVPB Pt feeling much better after IV fluids and meds- pt tolerating PO fluids and crackers  Assessment and Plan  A: Nausea and vomiting in early pregnancy Turned care over to Pamelia HoitSusan Aurelius Gildersleeve, NP at 8 pm Discharge home Rx Phenergan and Reglan F/u with Dr. Lilly CoveMarshall Barefoot, Rubbie BattiestLinda Miller 04/07/2014, 7:34 PM

## 2014-04-07 NOTE — MAU Note (Signed)
Pt request work note. Cynthia BushyS. Lineberry NP notified. NP said pt could be off work until Monday

## 2014-04-07 NOTE — MAU Note (Signed)
LMP end of May. Has not taken pregnancy test at home. Nausea & vomiting; states can't keep anything down. Denies any other complaints.

## 2014-04-07 NOTE — Discharge Instructions (Signed)
Eating Plan for Hyperemesis Gravidarum Severe cases of hyperemesis gravidarum can lead to dehydration and malnutrition. The hyperemesis eating plan is one way to lessen the symptoms of nausea and vomiting. It is often used with prescribed medicines to control your symptoms.  WHAT CAN I DO TO RELIEVE MY SYMPTOMS? Listen to your body. Everyone is different and has different preferences. Find what works best for you. Some of the following things may help:  Eat and drink slowly.  Eat 5-6 small meals daily instead of 3 large meals.   Eat crackers before you get out of bed in the morning.   Starchy foods are usually well tolerated (such as cereal, toast, bread, potatoes, pasta, rice, and pretzels).   Ginger may help with nausea. Add  tsp ground ginger to hot tea or choose ginger tea.   Try drinking 100% fruit juice or an electrolyte drink.  Continue to take your prenatal vitamins as directed by your health care provider. If you are having trouble taking your prenatal vitamins, talk with your health care provider about different options.  Include at least 1 serving of protein with your meals and snacks (such as meats or poultry, beans, nuts, eggs, or yogurt). Try eating a protein-rich snack before bed (such as cheese and crackers or a half [place] or peanut butter sandwich). WHAT THINGS SHOULD I AVOID TO REDUCE MY SYMPTOMS? The following things may help reduce your symptoms:  Avoid foods with strong smells. Try eating meals in well-ventilated areas that are free of odors.  Avoid drinking water or other beverages with meals. Try not to drink anything less than 30 minutes before and after meals.  Avoid drinking more than 1 cup of fluid at a time.  Avoid fried or high-fat foods, such as butter and cream sauces.  Avoid spicy foods.  Avoid skipping meals the best you can. Nausea can be more intense on an empty stomach. If you cannot tolerate food at that time, do not force it. Try sucking on  ice chips or other frozen items and make up the calories later.  Avoid lying down within 2 hours after eating. Document Released: 07/05/2007 Document Revised: 09/12/2013 Document Reviewed: 07/12/2013 [facility] Patient Information 2015 [place], [place]. This information is not intended to replace advice given to you by your health care provider. Make sure you discuss any questions you have with your health care provider.  Hyperemesis Gravidarum Hyperemesis gravidarum is a severe form of nausea and vomiting that happens during pregnancy. Hyperemesis is worse than morning sickness. It may cause you to have nausea or vomiting all day for many days. It may keep you from eating and drinking enough food and liquids. Hyperemesis usually occurs during the first half (the first 20 weeks) of pregnancy. It often goes away once a woman is in her second half of pregnancy. However, sometimes hyperemesis continues through an entire pregnancy.  CAUSES  The cause of this condition is not completely known but is thought to be related to changes in the body's hormones when pregnant. It could be from the high level of the pregnancy hormone or an increase in estrogen in the body.  SIGNS AND SYMPTOMS   Severe nausea and vomiting.  Nausea that does not go away.  Vomiting that does not allow you to keep any food down.  Weight loss and body fluid loss (dehydration).  Having no desire to eat or not liking food you have previously enjoyed. DIAGNOSIS  Your health care provider will do a physical exam  and ask you about your symptoms. He or she may also order blood tests and urine tests to make sure something else is not causing the problem.  TREATMENT  You may only need medicine to control the problem. If medicines do not control the nausea and vomiting, you will be treated in the hospital to prevent dehydration, increased acid in the blood (acidosis), weight loss, and changes in the electrolytes in your body that may harm  the unborn baby (fetus). You may need IV fluids.  HOME CARE INSTRUCTIONS   Only take over-the-counter or prescription medicines as directed by your health care provider.  Try eating a couple of dry crackers or toast in the morning before getting out of bed.  Avoid foods and smells that upset your stomach.  Avoid fatty and spicy foods.  Eat 5-6 small meals a day.  Do not drink when eating meals. Drink between meals.  For snacks, eat high-protein foods, such as cheese.  Eat or suck on things that have ginger in them. Ginger helps nausea.  Avoid food preparation. The smell of food can spoil your appetite.  Avoid iron pills and iron in your multivitamins until after 3-4 months of being pregnant. However, consult with your health care provider before stopping any prescribed iron pills. SEEK MEDICAL CARE IF:   Your abdominal pain increases.  You have a severe headache.  You have vision problems.  You are losing weight. SEEK IMMEDIATE MEDICAL CARE IF:   You are unable to keep fluids down.  You vomit blood.  You have constant nausea and vomiting.  You have excessive weakness.  You have extreme thirst.  You have dizziness or fainting.  You have a fever or persistent symptoms for more than 2-3 days.  You have a fever and your symptoms suddenly get worse. MAKE SURE YOU:   Understand these instructions.  Will watch your condition.  Will get help right away if you are not doing well or get worse. Document Released: 09/07/2005 Document Revised: 06/28/2013 Document Reviewed: 04/19/2013 [facility] Patient Information 2015 [place], [place]. This information is not intended to replace advice given to you by your health care provider. Make sure you discuss any questions you have with your health care provider.  Morning Sickness Morning sickness is when you feel sick to your stomach (nauseous) during pregnancy. You may feel sick to your stomach and throw up (vomit). You may feel  sick in the morning, but you can feel this way any time of day. Some women feel very sick to their stomach and cannot stop throwing up (hyperemesis gravidarum). HOME CARE  Only take medicines as told by your doctor.  Take multivitamins as told by your doctor. Taking multivitamins before getting pregnant can stop or lessen the harshness of morning sickness.  Eat dry toast or unsalted crackers before getting out of bed.  Eat 5 to 6 small meals a day.  Eat dry and bland foods like rice and baked potatoes.  Do not drink liquids with meals. Drink between meals.  Do not eat greasy, fatty, or spicy foods.  Have someone cook for you if the smell of food causes you to feel sick or throw up.  If you feel sick to your stomach after taking prenatal vitamins, take them at night or with a snack.  Eat protein when you need a snack (nuts, yogurt, cheese).  Eat unsweetened gelatins for dessert.  Wear a bracelet used for sea sickness (acupressure wristband).  Go to a doctor that puts  thin needles into certain body points (acupuncture) to improve how you feel.  Do not smoke.  Use a humidifier to keep the air in your house free of odors.  Get lots of fresh air. GET HELP IF:  You need medicine to feel better.  You feel dizzy or lightheaded.  You are losing weight. GET HELP RIGHT AWAY IF:   You feel very sick to your stomach and cannot stop throwing up.  You pass out (faint). MAKE SURE YOU:  Understand these instructions.  Will watch your condition.  Will get help right away if you are not doing well or get worse. Document Released: 10/15/2004 Document Revised: 09/12/2013 Document Reviewed: 02/22/2013 [facility] Patient Information 2015 [place]. This information is not intended to replace advice given to you by your health care provider. Make sure you discuss any questions you have with your health care provider.

## 2014-04-08 ENCOUNTER — Emergency Department (HOSPITAL_COMMUNITY)
Admission: EM | Admit: 2014-04-08 | Discharge: 2014-04-08 | Disposition: A | Discharge status: Home / Self Care | Payer: Medicaid Other | Attending: Emergency Medicine | Admitting: Emergency Medicine

## 2014-04-08 ENCOUNTER — Emergency Department (HOSPITAL_COMMUNITY): Payer: Medicaid Other

## 2014-04-08 ENCOUNTER — Encounter (HOSPITAL_COMMUNITY): Payer: Self-pay | Admitting: Emergency Medicine

## 2014-04-08 DIAGNOSIS — R259 Unspecified abnormal involuntary movements: Secondary | ICD-10-CM | POA: Insufficient documentation

## 2014-04-08 DIAGNOSIS — Z8669 Personal history of other diseases of the nervous system and sense organs: Secondary | ICD-10-CM | POA: Diagnosis not present

## 2014-04-08 DIAGNOSIS — O9989 Other specified diseases and conditions complicating pregnancy, childbirth and the puerperium: Secondary | ICD-10-CM | POA: Insufficient documentation

## 2014-04-08 DIAGNOSIS — R079 Chest pain, unspecified: Secondary | ICD-10-CM | POA: Diagnosis not present

## 2014-04-08 DIAGNOSIS — R112 Nausea with vomiting, unspecified: Secondary | ICD-10-CM

## 2014-04-08 DIAGNOSIS — O210 Mild hyperemesis gravidarum: Secondary | ICD-10-CM | POA: Insufficient documentation

## 2014-04-08 LAB — CBC
HEMATOCRIT: 35.1 % — AB (ref 36.0–46.0)
HEMOGLOBIN: 12.8 g/dL (ref 12.0–15.0)
MCH: 28.1 pg (ref 26.0–34.0)
MCHC: 36.5 g/dL — AB (ref 30.0–36.0)
MCV: 77.1 fL — ABNORMAL LOW (ref 78.0–100.0)
Platelets: 214 10*3/uL (ref 150–400)
RBC: 4.55 MIL/uL (ref 3.87–5.11)
RDW: 14.7 % (ref 11.5–15.5)
WBC: 17 10*3/uL — AB (ref 4.0–10.5)

## 2014-04-08 LAB — COMPREHENSIVE METABOLIC PANEL
ALT: 12 U/L (ref 0–35)
ANION GAP: 21 — AB (ref 5–15)
AST: 21 U/L (ref 0–37)
Albumin: 4.4 g/dL (ref 3.5–5.2)
Alkaline Phosphatase: 39 U/L (ref 39–117)
BILIRUBIN TOTAL: 1.3 mg/dL — AB (ref 0.3–1.2)
BUN: 12 mg/dL (ref 6–23)
CHLORIDE: 106 meq/L (ref 96–112)
CO2: 20 meq/L (ref 19–32)
Calcium: 10 mg/dL (ref 8.4–10.5)
Creatinine, Ser: 0.69 mg/dL (ref 0.50–1.10)
GLUCOSE: 103 mg/dL — AB (ref 70–99)
POTASSIUM: 3.8 meq/L (ref 3.7–5.3)
SODIUM: 147 meq/L (ref 137–147)
Total Protein: 8.6 g/dL — ABNORMAL HIGH (ref 6.0–8.3)

## 2014-04-08 LAB — HCG, QUANTITATIVE, PREGNANCY: hCG, Beta Chain, Quant, S: 152190 m[IU]/mL — ABNORMAL HIGH (ref <5)

## 2014-04-08 MED ORDER — ONDANSETRON 4 MG PO TBDP: 4.0000 mg | ORAL_TABLET | Freq: Three times a day (TID) | ORAL | Status: DC | PRN

## 2014-04-08 MED ORDER — ONDANSETRON HCL 4 MG/2ML IJ SOLN
4.0000 mg | Freq: Once | INTRAMUSCULAR | Status: AC
  Administered 2014-04-08: 4 mg via INTRAVENOUS
  Filled 2014-04-08: qty 2

## 2014-04-08 MED ORDER — SODIUM CHLORIDE 0.9 % IV BOLUS (SEPSIS)
1000.0000 mL | Freq: Once | INTRAVENOUS | Status: AC
Start: 2014-04-08 — End: 2014-04-08
  Administered 2014-04-08: 1000 mL via INTRAVENOUS

## 2014-04-08 MED ORDER — DIPHENHYDRAMINE HCL 25 MG PO CAPS
25.0000 mg | ORAL_CAPSULE | Freq: Once | ORAL | Status: AC
Start: 2014-04-08 — End: 2014-04-08
  Administered 2014-04-08: 25 mg via ORAL
  Filled 2014-04-08: qty 1

## 2014-04-08 MED ORDER — ONDANSETRON HCL 4 MG PO TABS: 4.0000 mg | ORAL_TABLET | Freq: Four times a day (QID) | ORAL | Status: DC

## 2014-04-08 MED ORDER — MORPHINE SULFATE 4 MG/ML IJ SOLN
4.0000 mg | INTRAMUSCULAR | Status: DC
  Administered 2014-04-08 (×2): 4 mg via INTRAVENOUS
  Filled 2014-04-08 (×2): qty 1

## 2014-04-08 NOTE — ED Notes (Signed)
MD at bedside. 

## 2014-04-08 NOTE — ED Provider Notes (Signed)
CSN: [id number]     Arrival date & time 04/08/14  1826 History   First MD Initiated Contact with Patient 04/08/14 2004     Chief Complaint  Patient presents with  . Shaking  . Nausea  . Emesis     (Consider location/radiation/quality/duration/timing/severity/associated sxs/prior Treatment) Patient is a 23 y.o. female presenting with vomiting.  Emesis Severity:  Severe Duration:  4 weeks Timing:  Constant Chronicity:  New Relieved by:  Nothing Associated symptoms: no abdominal pain, no diarrhea and no headaches     Past Medical History  Diagnosis Date  . Narcolepsy   . No pertinent past medical history    Past Surgical History  Procedure Laterality Date  . Dilation and curettage of uterus     Family History  Problem Relation Age of Onset  . Hypertension Other    History  Substance Use Topics  . Smoking status: Never Smoker   . Smokeless tobacco: Never Used  . Alcohol Use: Yes     Comment: socially   OB History   Grav Para Term Preterm Abortions TAB SAB Ect Mult Living   6 1 1  3 3    1      Review of Systems  Constitutional: Negative for activity change.  HENT: Negative for congestion.   Respiratory: Positive for chest tightness. Negative for cough and shortness of breath.   Cardiovascular: Negative for chest pain and leg swelling.  Gastrointestinal: Positive for nausea and vomiting. Negative for abdominal pain, diarrhea, constipation, blood in stool and abdominal distention.  Genitourinary: Negative for dysuria, flank pain and vaginal discharge.  Musculoskeletal: Negative for back pain.  Skin: Negative for color change.  Neurological: Negative for syncope and headaches.  Psychiatric/Behavioral: Negative for agitation.  All other systems reviewed and are negative.     Allergies  Review of patient's allergies indicates no known allergies.  Home Medications   Prior to Admission medications   Medication Sig Start Date End Date Taking? Authorizing  Provider  metoCLOPramide (REGLAN) 10 MG tablet Take 1 tablet (10 mg total) by mouth every 6 (six) hours. 04/07/14  Yes Jean RosenthalSusan P Lineberry, NP  promethazine (PHENERGAN) 25 MG tablet Take 1 tablet (25 mg total) by mouth every 6 (six) hours as needed for nausea or vomiting. 04/07/14  Yes Jean RosenthalSusan P Lineberry, NP   BP 159/92  Pulse 84  Temp(Src) 98.5 F (36.9 C) (Oral)  Resp 22  Ht 4\' 11"  (1.499 m)  Wt 112 lb 6 oz (50.973 kg)  BMI 22.68 kg/m2  SpO2 99%  LMP 02/02/2014  Breastfeeding? No Physical Exam  Constitutional: She is oriented to person, place, and time. She appears well-developed.  HENT:  Head: Normocephalic.  Eyes: Pupils are equal, round, and reactive to light.  Neck: Neck supple.  Cardiovascular: Normal rate.  Exam reveals no gallop and no friction rub.   No murmur heard. Tenderness to palpation of chest wall   Pulmonary/Chest: Effort normal and breath sounds normal. No respiratory distress.  Abdominal: Soft. She exhibits no distension. There is no tenderness. There is no rebound.  Musculoskeletal: She exhibits no edema.  Neurological: She is alert and oriented to person, place, and time.  Skin: Skin is warm.  Psychiatric: She has a normal mood and affect.    ED Course  Procedures (including critical care time) Labs Review Labs Reviewed  CBC - Abnormal; Notable for the following:    WBC 17.0 (*)    HCT 35.1 (*)    MCV 77.1 (*)  MCHC 36.5 (*)    All other components within normal limits  COMPREHENSIVE METABOLIC PANEL - Abnormal; Notable for the following:    Glucose, Bld 103 (*)    Total Protein 8.6 (*)    Total Bilirubin 1.3 (*)    Anion gap 21 (*)    All other components within normal limits  HCG, QUANTITATIVE, PREGNANCY - Abnormal; Notable for the following:    hCG, Beta Chain, Quant, [place] 161096 (*)    All other components within normal limits    Imaging Review Dg Chest Port 1 View  04/08/2014   CLINICAL DATA:  Shaking sensation with nausea and vomiting.   EXAM: PORTABLE CHEST - 1 VIEW  COMPARISON:  10/29/2004 radiographs.  FINDINGS: 2039 hr. The heart size and mediastinal contours are normal. The lungs are clear. There is no pleural effusion or pneumothorax. No acute osseous findings are identified.  IMPRESSION: No active cardiopulmonary process.   Electronically Signed   By: Roxy Horseman M.D.   On: 04/08/2014 20:53     EKG Interpretation None      MDM   Final diagnoses:  Severe nausea and vomiting   23 year old female with no significant past medical history who is [redacted] weeks pregnant and presents with severe nausea vomiting. Patient states that she has been unable to tolerate by mouth for several weeks. Patient has now started to develop chest pain with the severe vomiting. Initially concerned for Boerhaave syndrome. Patient has no free air on chest x-ray and pain was managed with morphine. Patient's course does not suggest this diagnosis. Patient likely is suffering from hyperemesis gravidarum. Screening labs are unremarkable. No electrolyte abnormalities. Patient managed with IV fluids and Zofran in the department. He should discharged with prescription for Zofran and instructions to followup with OB/GYN for prenatal care and further management.      Clement Sayres, MD 04/10/14 [phone]

## 2014-04-08 NOTE — ED Notes (Addendum)
Pt c/o N/V, hot and cold chills and uncontrollable shaking. Pt reports that she is unable to eat. Symptoms ongoing since b4 July 4th. [facility]. Pt is currently pregnant LMP in May.

## 2014-04-10 ENCOUNTER — Observation Stay (HOSPITAL_COMMUNITY): Payer: Medicaid Other

## 2014-04-10 ENCOUNTER — Encounter (HOSPITAL_COMMUNITY): Payer: Self-pay | Admitting: *Deleted

## 2014-04-10 ENCOUNTER — Inpatient Hospital Stay (HOSPITAL_COMMUNITY)
Admission: AD | Admit: 2014-04-10 | Discharge: 2014-04-12 | DRG: 781 | Disposition: A | Discharge status: Home / Self Care | Payer: Medicaid Other | Source: Ambulatory Visit | Attending: Obstetrics & Gynecology | Admitting: Obstetrics & Gynecology

## 2014-04-10 DIAGNOSIS — Z8249 Family history of ischemic heart disease and other diseases of the circulatory system: Secondary | ICD-10-CM

## 2014-04-10 DIAGNOSIS — G47419 Narcolepsy without cataplexy: Secondary | ICD-10-CM

## 2014-04-10 DIAGNOSIS — O211 Hyperemesis gravidarum with metabolic disturbance: Principal | ICD-10-CM | POA: Diagnosis present

## 2014-04-10 DIAGNOSIS — O210 Mild hyperemesis gravidarum: Secondary | ICD-10-CM | POA: Diagnosis present

## 2014-04-10 DIAGNOSIS — E860 Dehydration: Secondary | ICD-10-CM

## 2014-04-10 LAB — URINALYSIS, ROUTINE W REFLEX MICROSCOPIC
Glucose, UA: NEGATIVE mg/dL
HGB URINE DIPSTICK: NEGATIVE
Ketones, ur: >80 mg/dL — AB
Leukocytes, UA: NEGATIVE
Nitrite: NEGATIVE
PROTEIN: 100 mg/dL — AB
Specific Gravity, Urine: 1.02 (ref 1.005–1.030)
UROBILINOGEN UA: 4 mg/dL — AB (ref 0.0–1.0)
pH: 7 (ref 5.0–8.0)

## 2014-04-10 LAB — COMPREHENSIVE METABOLIC PANEL
ALBUMIN: 4 g/dL (ref 3.5–5.2)
ALK PHOS: 36 U/L — AB (ref 39–117)
ALT: 14 U/L (ref 0–35)
ANION GAP: 15 (ref 5–15)
AST: 22 U/L (ref 0–37)
BUN: 10 mg/dL (ref 6–23)
CO2: 24 mEq/L (ref 19–32)
Calcium: 10.1 mg/dL (ref 8.4–10.5)
Chloride: 102 mEq/L (ref 96–112)
Creatinine, Ser: 0.62 mg/dL (ref 0.50–1.10)
GFR calc Af Amer: >90 mL/min (ref >90)
GFR calc non Af Amer: >90 mL/min (ref >90)
Glucose, Bld: 96 mg/dL (ref 70–99)
POTASSIUM: 4.1 meq/L (ref 3.7–5.3)
Sodium: 141 mEq/L (ref 137–147)
TOTAL PROTEIN: 7.8 g/dL (ref 6.0–8.3)
Total Bilirubin: 0.8 mg/dL (ref 0.3–1.2)

## 2014-04-10 LAB — URINE MICROSCOPIC-ADD ON

## 2014-04-10 LAB — CBC
HCT: 35.3 % — ABNORMAL LOW (ref 36.0–46.0)
HEMOGLOBIN: 12.8 g/dL (ref 12.0–15.0)
MCH: 28.4 pg (ref 26.0–34.0)
MCHC: 36.3 g/dL — ABNORMAL HIGH (ref 30.0–36.0)
MCV: 78.3 fL (ref 78.0–100.0)
Platelets: 212 10*3/uL (ref 150–400)
RBC: 4.51 MIL/uL (ref 3.87–5.11)
RDW: 14.8 % (ref 11.5–15.5)
WBC: 16.5 10*3/uL — ABNORMAL HIGH (ref 4.0–10.5)

## 2014-04-10 LAB — LIPASE, BLOOD: Lipase: 44 U/L (ref 11–59)

## 2014-04-10 LAB — AMYLASE: Amylase: 137 U/L — ABNORMAL HIGH (ref 0–105)

## 2014-04-10 LAB — TSH: TSH: 0.014 u[IU]/mL — AB (ref 0.350–4.500)

## 2014-04-10 MED ORDER — PROMETHAZINE HCL 25 MG/ML IJ SOLN
25.0000 mg | Freq: Once | INTRAMUSCULAR | Status: AC
  Administered 2014-04-10: 25 mg via INTRAVENOUS
  Filled 2014-04-10: qty 1

## 2014-04-10 MED ORDER — DEXTROSE 5 % IN LACTATED RINGERS IV BOLUS
1000.0000 mL | Freq: Once | INTRAVENOUS | Status: AC
  Administered 2014-04-10: 1000 mL via INTRAVENOUS

## 2014-04-10 MED ORDER — PRENATAL MULTIVITAMIN CH
1.0000 | ORAL_TABLET | Freq: Every day | ORAL | Status: DC
  Administered 2014-04-11: 1 via ORAL
  Filled 2014-04-10 (×2): qty 1

## 2014-04-10 MED ORDER — METOCLOPRAMIDE HCL 5 MG/ML IJ SOLN: 10.0000 mg | Freq: Once | INTRAMUSCULAR | Status: DC

## 2014-04-10 MED ORDER — PROMETHAZINE HCL 25 MG/ML IJ SOLN: 12.5000 mg | Freq: Once | INTRAMUSCULAR | Status: DC

## 2014-04-10 MED ORDER — ONDANSETRON 8 MG/NS 50 ML IVPB
8.0000 mg | Freq: Three times a day (TID) | INTRAVENOUS | Status: DC
  Administered 2014-04-10 – 2014-04-11 (×2): 8 mg via INTRAVENOUS
  Filled 2014-04-10 (×3): qty 8

## 2014-04-10 MED ORDER — FAMOTIDINE IN NACL 20-0.9 MG/50ML-% IV SOLN
20.0000 mg | Freq: Once | INTRAVENOUS | Status: AC
  Administered 2014-04-10: 20 mg via INTRAVENOUS
  Filled 2014-04-10: qty 50

## 2014-04-10 MED ORDER — DEXTROSE-NACL 5-0.9 % IV SOLN
INTRAVENOUS | Status: DC
  Administered 2014-04-11 – 2014-04-12 (×8): via INTRAVENOUS

## 2014-04-10 NOTE — H&P (Signed)
HPI  Pt is [id number] @ [redacted]w[redacted]d pregnant presenting with hyperemisis in pregnancy. Pt was seen on 04/07/2014 in MAU and given  IVF and antiemetics along with pepcid 20mg  IVPB. Pt was sent home with prescription for phenergan and Reglan. Pt  Left without discharge instructions. Pt then went to [facility] ED With chest tightness along with hyperemesis.  A portable CXR was done that revealed no cardiopulmonary process. Pt was given IVF and zofran and discharged home.  Pt has been vomiting bile and complains of esophageal burning. Pt has tried to eat crackers and drink ginger ale.  Pt has not seen Dr. Gaynell Face in "years" and has not established care with this pregnancy yet.  Past Medical History   Diagnosis  Date   .  Narcolepsy    .  No pertinent past medical history     Past Surgical History   Procedure  Laterality  Date   .  Dilation and curettage of uterus      Family History   Problem  Relation  Age of Onset   .  Hypertension  Other     History   Substance Use Topics   .  Smoking status:  Never Smoker   .  Smokeless tobacco:  Never Used   .  Alcohol Use:  Yes      Comment: socially   Allergies: No Known Allergies  Prescriptions prior to admission   Medication  Sig  Dispense  Refill   .  metoCLOPramide (REGLAN) 10 MG tablet  Take 1 tablet (10 mg total) by mouth every 6 (six) hours.  30 tablet  0   .  ondansetron (ZOFRAN ODT) 4 MG disintegrating tablet  Take 1 tablet (4 mg total) by mouth every 8 (eight) hours as needed for nausea or vomiting.  20 tablet  0   .  ondansetron (ZOFRAN) 4 MG tablet  Take 1 tablet (4 mg total) by mouth every 6 (six) hours.  20 tablet  0   .  promethazine (PHENERGAN) 25 MG tablet  Take 1 tablet (25 mg total) by mouth every 6 (six) hours as needed for nausea or vomiting.  30 tablet  0   Review of Systems  Constitutional: Positive for chills and malaise/fatigue. Negative for fever and weight loss.  Gastrointestinal: Positive for heartburn, nausea and vomiting. Negative  for abdominal pain, diarrhea and constipation.  Genitourinary: Negative for dysuria.  Physical Exam   Blood pressure 108/59, pulse 116, temperature 98.5 F (36.9 C), temperature source Oral, resp. rate 20, last menstrual period 02/02/2014.  Physical Exam  Vitals reviewed.  Constitutional: She is oriented to person, place, and time. She appears well-developed and well-nourished.  vomiting  HENT:  Head: Normocephalic.  Eyes: Pupils are equal, round, and reactive to light.  Neck: Normal range of motion.  Cardiovascular: Normal rate.  Respiratory: Effort normal.  GI: Soft.  Musculoskeletal: Normal range of motion.  Neurological: She is alert and oriented to person, place, and time.  Skin: Skin is warm and dry.  Psychiatric: She has a normal mood and affect.  MAU Course   Procedures  Results for orders placed during the hospital encounter of 04/10/14 (from the past 24 hour(s))   URINALYSIS, ROUTINE W REFLEX MICROSCOPIC Status: Abnormal    Collection Time    04/10/14 4:50 PM   Result  Value  Ref Range    Color, Urine  AMBER (*)  YELLOW    APPearance  CLEAR  CLEAR    Specific Gravity, Urine  1.020  1.005 - 1.030    pH  7.0  5.0 - 8.0    Glucose, UA  NEGATIVE  NEGATIVE mg/dL    Hgb urine dipstick  NEGATIVE  NEGATIVE    Bilirubin Urine  SMALL (*)  NEGATIVE    Ketones, ur  >80 (*)  NEGATIVE mg/dL    Protein, ur  563100 (*)  NEGATIVE mg/dL    Urobilinogen, UA  4.0 (*)  0.0 - 1.0 mg/dL    Nitrite  NEGATIVE  NEGATIVE    Leukocytes, UA  NEGATIVE  NEGATIVE   URINE MICROSCOPIC-ADD ON Status: Abnormal    Collection Time    04/10/14 4:50 PM   Result  Value  Ref Range    Squamous Epithelial / LPF  MANY (*)  RARE    WBC, UA  7-10  <3 WBC/hpf    RBC / HPF  0-2  <3 RBC/hpf    Bacteria, UA  MANY (*)  RARE    Urine-Other  MUCOUS PRESENT    CBC Status: Abnormal    Collection Time    04/10/14 5:40 PM   Result  Value  Ref Range    WBC  16.5 (*)  4.0 - 10.5 K/uL    RBC  4.51  3.87 - 5.11 MIL/uL     Hemoglobin  12.8  12.0 - 15.0 g/dL    HCT  87.535.3 (*)  64.336.0 - 46.0 %    MCV  78.3  78.0 - 100.0 fL    MCH  28.4  26.0 - 34.0 pg    MCHC  36.3 (*)  30.0 - 36.0 g/dL    RDW  32.914.8  51.811.5 - 84.115.5 %    Platelets  212  150 - 400 K/uL   COMPREHENSIVE METABOLIC PANEL Status: Abnormal    Collection Time    04/10/14 5:40 PM   Result  Value  Ref Range    Sodium  141  137 - 147 mEq/L    Potassium  4.1  3.7 - 5.3 mEq/L    Chloride  102  96 - 112 mEq/L    CO2  24  19 - 32 mEq/L    Glucose, Bld  96  70 - 99 mg/dL    BUN  10  6 - 23 mg/dL    Creatinine, Ser  6.600.62  0.50 - 1.10 mg/dL    Calcium  63.010.1  8.4 - 10.5 mg/dL    Total Protein  7.8  6.0 - 8.3 g/dL    Albumin  4.0  3.5 - 5.2 g/dL    AST  22  0 - 37 U/L    ALT  14  0 - 35 U/L    Alkaline Phosphatase  36 (*)  39 - 117 U/L    Total Bilirubin  0.8  0.3 - 1.2 mg/dL    GFR calc non Af Amer  >90  >90 mL/min    GFR calc Af Amer  >90  >90 mL/min    Anion gap  15  5 - 15   LIPASE, BLOOD Status: None    Collection Time    04/10/14 5:40 PM   Result  Value  Ref Range    Lipase  44  11 - 59 U/L   AMYLASE Status: Abnormal    Collection Time    04/10/14 5:40 PM   Result  Value  Ref Range    Amylase  137 (*)  0 - 105 U/L   IVF D5LR with  phenergan 25mg  IV and pepcid 20mg  IVPB A/P. [redacted] weeks EGA. Hyperemesis. Admit for fluid

## 2014-04-10 NOTE — MAU Provider Note (Signed)
History     CSN: [id number]  Arrival date and time: 04/10/14 1644   First Provider Initiated Contact with Patient 04/10/14 1718      Chief Complaint  Patient presents with  . Emesis During Pregnancy   HPI  Pt is [id number] @ [redacted]w[redacted]d pregnant presenting  with hyperemisis in pregnancy.  Pt was seen on 04/07/2014 in MAU and given IVF and antiemetics along with pepcid 20mg  IVPB.  Pt was sent home with prescription for phenergan and Reglan.  Pt Left without discharge instructions.  Pt then went to [facility] ED  With chest tightness along with hyperemesis. A portable CXR was done that revealed no cardiopulmonary process.  Pt was given IVF and zofran and discharged home. Pt has been vomiting bile and complains of esophageal burning.  Pt has tried to eat crackers and drink ginger ale. Pt has not seen Dr. Gaynell Face in "years" and has not established care with this pregnancy yet.  Past Medical History  Diagnosis Date  . Narcolepsy   . No pertinent past medical history     Past Surgical History  Procedure Laterality Date  . Dilation and curettage of uterus      Family History  Problem Relation Age of Onset  . Hypertension Other     History  Substance Use Topics  . Smoking status: Never Smoker   . Smokeless tobacco: Never Used  . Alcohol Use: Yes     Comment: socially    Allergies: No Known Allergies  Prescriptions prior to admission  Medication Sig Dispense Refill  . metoCLOPramide (REGLAN) 10 MG tablet Take 1 tablet (10 mg total) by mouth every 6 (six) hours.  30 tablet  0  . ondansetron (ZOFRAN ODT) 4 MG disintegrating tablet Take 1 tablet (4 mg total) by mouth every 8 (eight) hours as needed for nausea or vomiting.  20 tablet  0  . ondansetron (ZOFRAN) 4 MG tablet Take 1 tablet (4 mg total) by mouth every 6 (six) hours.  20 tablet  0  . promethazine (PHENERGAN) 25 MG tablet Take 1 tablet (25 mg total) by mouth every 6 (six) hours as needed for nausea or vomiting.  30 tablet  0     Review of Systems  Constitutional: Positive for chills and malaise/fatigue. Negative for fever and weight loss.  Gastrointestinal: Positive for heartburn, nausea and vomiting. Negative for abdominal pain, diarrhea and constipation.  Genitourinary: Negative for dysuria.   Physical Exam   Blood pressure 108/59, pulse 116, temperature 98.5 F (36.9 C), temperature source Oral, resp. rate 20, last menstrual period 02/02/2014.  Physical Exam  Vitals reviewed. Constitutional: She is oriented to person, place, and time. She appears well-developed and well-nourished.  vomiting  HENT:  Head: Normocephalic.  Eyes: Pupils are equal, round, and reactive to light.  Neck: Normal range of motion.  Cardiovascular: Normal rate.   Respiratory: Effort normal.  GI: Soft.  Musculoskeletal: Normal range of motion.  Neurological: She is alert and oriented to person, place, and time.  Skin: Skin is warm and dry.  Psychiatric: She has a normal mood and affect.    MAU Course  Procedures Results for orders placed during the hospital encounter of 04/10/14 (from the past 24 hour(s))  URINALYSIS, ROUTINE W REFLEX MICROSCOPIC     Status: Abnormal   Collection Time    04/10/14  4:50 PM      Result Value Ref Range   Color, Urine AMBER (*) YELLOW   APPearance CLEAR  CLEAR  Specific Gravity, Urine 1.020  1.005 - 1.030   pH 7.0  5.0 - 8.0   Glucose, UA NEGATIVE  NEGATIVE mg/dL   Hgb urine dipstick NEGATIVE  NEGATIVE   Bilirubin Urine SMALL (*) NEGATIVE   Ketones, ur >80 (*) NEGATIVE mg/dL   Protein, ur 161100 (*) NEGATIVE mg/dL   Urobilinogen, UA 4.0 (*) 0.0 - 1.0 mg/dL   Nitrite NEGATIVE  NEGATIVE   Leukocytes, UA NEGATIVE  NEGATIVE  URINE MICROSCOPIC-ADD ON     Status: Abnormal   Collection Time    04/10/14  4:50 PM      Result Value Ref Range   Squamous Epithelial / LPF MANY (*) RARE   WBC, UA 7-10  <3 WBC/hpf   RBC / HPF 0-2  <3 RBC/hpf   Bacteria, UA MANY (*) RARE   Urine-Other MUCOUS  PRESENT    CBC     Status: Abnormal   Collection Time    04/10/14  5:40 PM      Result Value Ref Range   WBC 16.5 (*) 4.0 - 10.5 K/uL   RBC 4.51  3.87 - 5.11 MIL/uL   Hemoglobin 12.8  12.0 - 15.0 g/dL   HCT 09.635.3 (*) 04.536.0 - 40.946.0 %   MCV 78.3  78.0 - 100.0 fL   MCH 28.4  26.0 - 34.0 pg   MCHC 36.3 (*) 30.0 - 36.0 g/dL   RDW 81.114.8  91.411.5 - 78.215.5 %   Platelets 212  150 - 400 K/uL  COMPREHENSIVE METABOLIC PANEL     Status: Abnormal   Collection Time    04/10/14  5:40 PM      Result Value Ref Range   Sodium 141  137 - 147 mEq/L   Potassium 4.1  3.7 - 5.3 mEq/L   Chloride 102  96 - 112 mEq/L   CO2 24  19 - 32 mEq/L   Glucose, Bld 96  70 - 99 mg/dL   BUN 10  6 - 23 mg/dL   Creatinine, Ser 9.560.62  0.50 - 1.10 mg/dL   Calcium 21.310.1  8.4 - 08.610.5 mg/dL   Total Protein 7.8  6.0 - 8.3 g/dL   Albumin 4.0  3.5 - 5.2 g/dL   AST 22  0 - 37 U/L   ALT 14  0 - 35 U/L   Alkaline Phosphatase 36 (*) 39 - 117 U/L   Total Bilirubin 0.8  0.3 - 1.2 mg/dL   GFR calc non Af Amer >90  >90 mL/min   GFR calc Af Amer >90  >90 mL/min   Anion gap 15  5 - 15  LIPASE, BLOOD     Status: None   Collection Time    04/10/14  5:40 PM      Result Value Ref Range   Lipase 44  11 - 59 U/L  AMYLASE     Status: Abnormal   Collection Time    04/10/14  5:40 PM      Result Value Ref Range   Amylase 137 (*) 0 - 105 U/L  IVF D5LR with phenergan 25mg  IV and pepcid 20mg  IVPB Dr. Marice Potterove consulted- will admit   Assessment and Plan    Cynthia [name] 04/10/2014, 6:49 PM

## 2014-04-10 NOTE — MAU Note (Signed)
Pt spitting, not throwing up

## 2014-04-10 NOTE — MAU Note (Signed)
Ongoing problem with vomiting. Burning in chest.  Yesterday morning was the last time she took something for it.

## 2014-04-10 NOTE — ED Provider Notes (Signed)
I saw and evaluated the patient, reviewed the resident's note and I agree with the findings and plan.   EKG Interpretation None      Patient with a known trimester pregnancy.  Feels better after fluids and antiemetics.  Follow closely are ready by her obstetrical team.  Discharge home in good condition.  Home with antinausea medicine and primary OB followup  Lyanne CoKevin M Jennika Ringgold, MD 04/10/14 [phone]

## 2014-04-11 LAB — T3, FREE: T3 FREE: 4.1 pg/mL (ref 2.3–4.2)

## 2014-04-11 LAB — T4, FREE: Free T4: 1.83 ng/dL — ABNORMAL HIGH (ref 0.80–1.80)

## 2014-04-11 MED ORDER — ONDANSETRON 8 MG PO TBDP
8.0000 mg | ORAL_TABLET | Freq: Three times a day (TID) | ORAL | Status: DC | PRN
  Filled 2014-04-11: qty 1

## 2014-04-11 MED ORDER — ONDANSETRON 8 MG/NS 50 ML IVPB
8.0000 mg | Freq: Three times a day (TID) | INTRAVENOUS | Status: DC | PRN
  Administered 2014-04-12: 8 mg via INTRAVENOUS
  Filled 2014-04-11 (×2): qty 8

## 2014-04-11 MED ORDER — FAMOTIDINE 20 MG PO TABS
20.0000 mg | ORAL_TABLET | Freq: Two times a day (BID) | ORAL | Status: DC
  Administered 2014-04-11 – 2014-04-12 (×3): 20 mg via ORAL
  Filled 2014-04-11 (×4): qty 1

## 2014-04-11 MED ORDER — PROMETHAZINE HCL 25 MG PO TABS: 25.0000 mg | ORAL_TABLET | Freq: Four times a day (QID) | ORAL | Status: DC | PRN

## 2014-04-11 NOTE — Progress Notes (Signed)
Pt's daughter came to the nursing station and stated the pt took her synthroid that she brought from home. Pt did not know she had it scheduled for this morning.

## 2014-04-11 NOTE — Progress Notes (Signed)
Subjective: Patient reports no vomiting since admission last night but not able to eat anything yet. Took one bite of hamburger, did not throw up.   Objective: I have reviewed patient's vital signs, intake and output, medications, labs and radiology results.  Temp:  [98.2 F (36.8 C)-98.9 F (37.2 C)] 98.2 F (36.8 C) (07/22 0529) Pulse Rate:  [90-116] 90 (07/22 0529) Resp:  [18-20] 18 (07/22 0529) BP: (108-139)/(53-93) 117/53 mmHg (07/22 0529) SpO2:  [100 %] 100 % (07/22 0529) Weight:  [112 lb (50.803 kg)-117 lb 12 oz (53.411 kg)] 117 lb 12 oz (53.411 kg) (07/22 0529)  General: alert GI: soft, non-tender; bowel sounds normal; no masses,  no organomegaly Extremities: extremities normal, atraumatic, no cyanosis or edema and Homans sign is negative, no sign of DVT  Results for orders placed during the hospital encounter of 04/10/14 (from the past 48 hour(s))  URINALYSIS, ROUTINE W REFLEX MICROSCOPIC     Status: Abnormal   Collection Time    04/10/14  4:50 PM      Result Value Ref Range   Color, Urine AMBER (*) YELLOW   Comment: BIOCHEMICALS MAY BE AFFECTED BY COLOR   APPearance CLEAR  CLEAR   Specific Gravity, Urine 1.020  1.005 - 1.030   pH 7.0  5.0 - 8.0   Glucose, UA NEGATIVE  NEGATIVE mg/dL   Hgb urine dipstick NEGATIVE  NEGATIVE   Bilirubin Urine SMALL (*) NEGATIVE   Ketones, ur >80 (*) NEGATIVE mg/dL   Protein, ur 100 (*) NEGATIVE mg/dL   Urobilinogen, UA 4.0 (*) 0.0 - 1.0 mg/dL   Nitrite NEGATIVE  NEGATIVE   Leukocytes, UA NEGATIVE  NEGATIVE  URINE MICROSCOPIC-ADD ON     Status: Abnormal   Collection Time    04/10/14  4:50 PM      Result Value Ref Range   Squamous Epithelial / LPF MANY (*) RARE   WBC, UA 7-10  <3 WBC/hpf   RBC / HPF 0-2  <3 RBC/hpf   Bacteria, UA MANY (*) RARE   Urine-Other MUCOUS PRESENT    CBC     Status: Abnormal   Collection Time    04/10/14  5:40 PM      Result Value Ref Range   WBC 16.5 (*) 4.0 - 10.5 K/uL   RBC 4.51  3.87 - 5.11 MIL/uL    Hemoglobin 12.8  12.0 - 15.0 g/dL   HCT 35.3 (*) 36.0 - 46.0 %   MCV 78.3  78.0 - 100.0 fL   MCH 28.4  26.0 - 34.0 pg   MCHC 36.3 (*) 30.0 - 36.0 g/dL   RDW 14.8  11.5 - 15.5 %   Platelets 212  150 - 400 K/uL  COMPREHENSIVE METABOLIC PANEL     Status: Abnormal   Collection Time    04/10/14  5:40 PM      Result Value Ref Range   Sodium 141  137 - 147 mEq/L   Potassium 4.1  3.7 - 5.3 mEq/L   Chloride 102  96 - 112 mEq/L   CO2 24  19 - 32 mEq/L   Glucose, Bld 96  70 - 99 mg/dL   BUN 10  6 - 23 mg/dL   Creatinine, Ser 0.62  0.50 - 1.10 mg/dL   Calcium 10.1  8.4 - 10.5 mg/dL   Total Protein 7.8  6.0 - 8.3 g/dL   Albumin 4.0  3.5 - 5.2 g/dL   AST 22  0 - 37 U/L   ALT  14  0 - 35 U/L   Alkaline Phosphatase 36 (*) 39 - 117 U/L   Total Bilirubin 0.8  0.3 - 1.2 mg/dL   GFR calc non Af Amer >90  >90 mL/min   GFR calc Af Amer >90  >90 mL/min   Comment: (NOTE)     The eGFR has been calculated using the CKD EPI equation.     This calculation has not been validated in all clinical situations.     eGFR's persistently <90 mL/min signify possible Chronic Kidney     Disease.   Anion gap 15  5 - 15  LIPASE, BLOOD     Status: None   Collection Time    04/10/14  5:40 PM      Result Value Ref Range   Lipase 44  11 - 59 U/L   Comment: Performed at [facility]     Status: Abnormal   Collection Time    04/10/14  5:40 PM      Result Value Ref Range   Amylase 137 (*) 0 - 105 U/L   Comment: Performed at [facility]  TSH     Status: Abnormal   Collection Time    04/10/14  5:40 PM      Result Value Ref Range   TSH 0.014 (*) 0.350 - 4.500 uIU/mL   Comment: Performed at [facility]    US Ob Comp Less 14 Wks  04/10/2014   CLINICAL DATA:  Nausea vomiting hyperemesis  EXAM: OBSTETRIC <14 WK ULTRASOUND  TECHNIQUE: Transabdominal ultrasound was performed for evaluation of the gestation as well as the maternal uterus and adnexal regions.  COMPARISON:  None.   FINDINGS: Intrauterine gestational sac: Visualized/normal in shape.  Yolk sac:  Present  Embryo:  Present  Cardiac Activity: Visualized  Heart Rate: 176 bpm  CRL:   36  mm   10 w for d                  [place] EDC: 11/02/2013  Maternal uterus/adnexae: Ovaries are normal. No subchorionic hemorrhage or free fluid.  IMPRESSION: Live intrauterine gestation   Electronically Signed   By: Skipper Cliche M.D.   On: 04/10/2014 21:21    Assessment/Plan: Awaiting diet tolerance Continue antiemetics  (Zofran, Phenergan and Pepcid) -> oral forms ordered Will check free T3 and T4, also H.pylori Continue antenatal care    LOS: 1 day    Francine Hannan A, MD 04/11/2014, 10:13 AM

## 2014-04-12 DIAGNOSIS — Z8249 Family history of ischemic heart disease and other diseases of the circulatory system: Secondary | ICD-10-CM | POA: Diagnosis not present

## 2014-04-12 DIAGNOSIS — O211 Hyperemesis gravidarum with metabolic disturbance: Secondary | ICD-10-CM | POA: Diagnosis present

## 2014-04-12 DIAGNOSIS — G47419 Narcolepsy without cataplexy: Secondary | ICD-10-CM | POA: Diagnosis present

## 2014-04-12 DIAGNOSIS — E860 Dehydration: Secondary | ICD-10-CM | POA: Diagnosis present

## 2014-04-12 LAB — HELICOBACTER PYLORI ABS-IGG+IGA, BLD
H Pylori IgA: 4.8 U/mL (ref <9.0)
H Pylori IgG: <0.4 {ISR}

## 2014-04-12 MED ORDER — ONDANSETRON 4 MG PO TBDP: 4.0000 mg | ORAL_TABLET | Freq: Three times a day (TID) | ORAL | Status: DC | PRN

## 2014-04-12 MED ORDER — PRENATAL MULTIVITAMIN CH: 1.0000 | ORAL_TABLET | Freq: Every day | ORAL | Status: DC

## 2014-04-12 MED ORDER — GLYCOPYRROLATE 1 MG PO TABS
1.0000 mg | ORAL_TABLET | Freq: Three times a day (TID) | ORAL | Status: DC | PRN
  Filled 2014-04-12: qty 2

## 2014-04-12 MED ORDER — GLYCOPYRROLATE 0.2 MG/ML IJ SOLN
0.2000 mg | Freq: Once | INTRAMUSCULAR | Status: AC | PRN
  Administered 2014-04-12: 0.2 mg via INTRAVENOUS
  Filled 2014-04-12: qty 1

## 2014-04-12 MED ORDER — PROMETHAZINE HCL 25 MG PO TABS: 25.0000 mg | ORAL_TABLET | Freq: Four times a day (QID) | ORAL | Status: DC | PRN

## 2014-04-12 MED ORDER — METOCLOPRAMIDE HCL 10 MG PO TABS: 10.0000 mg | ORAL_TABLET | Freq: Four times a day (QID) | ORAL | Status: DC

## 2014-04-12 MED ORDER — GLYCOPYRROLATE 1 MG PO TABS: 1.0000 mg | ORAL_TABLET | Freq: Three times a day (TID) | ORAL | Status: DC | PRN

## 2014-04-12 MED ORDER — FAMOTIDINE 20 MG PO TABS: 20.0000 mg | ORAL_TABLET | Freq: Two times a day (BID) | ORAL | Status: DC

## 2014-04-12 NOTE — Progress Notes (Signed)
UR chart review completed.  

## 2014-04-12 NOTE — Discharge Instructions (Signed)
Hyperemesis Gravidarum Hyperemesis gravidarum is a severe form of nausea and vomiting that happens during pregnancy. Hyperemesis is worse than morning sickness. It may cause you to have nausea or vomiting all day for many days. It may keep you from eating and drinking enough food and liquids. Hyperemesis usually occurs during the first half (the first 20 weeks) of pregnancy. It often goes away once a woman is in her second half of pregnancy. However, sometimes hyperemesis continues through an entire pregnancy.  CAUSES  The cause of this condition is not completely known but is thought to be related to changes in the body's hormones when pregnant. It could be from the high level of the pregnancy hormone or an increase in estrogen in the body.  SIGNS AND SYMPTOMS   Severe nausea and vomiting.  Nausea that does not go away.  Vomiting that does not allow you to keep any food down.  Weight loss and body fluid loss (dehydration).  Having no desire to eat or not liking food you have previously enjoyed. DIAGNOSIS  Your health care provider will do a physical exam and ask you about your symptoms. He or she may also order blood tests and urine tests to make sure something else is not causing the problem.  TREATMENT  You may only need medicine to control the problem. If medicines do not control the nausea and vomiting, you will be treated in the hospital to prevent dehydration, increased acid in the blood (acidosis), weight loss, and changes in the electrolytes in your body that may harm the unborn baby (fetus). You may need IV fluids.  HOME CARE INSTRUCTIONS   Only take over-the-counter or prescription medicines as directed by your health care provider.  Try eating a couple of dry crackers or toast in the morning before getting out of bed.  Avoid foods and smells that upset your stomach.  Avoid fatty and spicy foods.  Eat 5-6 small meals a day.  Do not drink when eating meals. Drink between  meals.  For snacks, eat high-protein foods, such as cheese.  Eat or suck on things that have ginger in them. Ginger helps nausea.  Avoid food preparation. The smell of food can spoil your appetite.  Avoid iron pills and iron in your multivitamins until after 3-4 months of being pregnant. However, consult with your health care provider before stopping any prescribed iron pills. SEEK MEDICAL CARE IF:   Your abdominal pain increases.  You have a severe headache.  You have vision problems.  You are losing weight. SEEK IMMEDIATE MEDICAL CARE IF:   You are unable to keep fluids down.  You vomit blood.  You have constant nausea and vomiting.  You have excessive weakness.  You have extreme thirst.  You have dizziness or fainting.  You have a fever or persistent symptoms for more than 2-3 days.  You have a fever and your symptoms suddenly get worse. MAKE SURE YOU:   Understand these instructions.  Will watch your condition.  Will get help right away if you are not doing well or get worse. Document Released: 09/07/2005 Document Revised: 06/28/2013 Document Reviewed: 04/19/2013 [facility] Patient Information 2015 [place], [place]. This information is not intended to replace advice given to you by your health care provider. Make sure you discuss any questions you have with your health care provider.   Eating Plan for Hyperemesis Gravidarum Severe cases of hyperemesis gravidarum can lead to dehydration and malnutrition. The hyperemesis eating plan is one way to lessen  the symptoms of nausea and vomiting. It is often used with prescribed medicines to control your symptoms.  °WHAT CAN I DO TO RELIEVE MY SYMPTOMS? °Listen to your body. Everyone is different and has different preferences. Find what works best for you. Some of the following things may help: °· Eat and drink slowly. °· Eat 5-6 small meals daily instead of 3 large meals.   °· Eat crackers before you get out of bed in the  morning.   °· Starchy foods are usually well tolerated (such as cereal, toast, bread, potatoes, pasta, rice, and pretzels).   °· Ginger may help with nausea. Add ¼ tsp ground ginger to hot tea or choose ginger tea.   °· Try drinking 100% fruit juice or an electrolyte drink. °· Continue to take your prenatal vitamins as directed by your health care provider. If you are having trouble taking your prenatal vitamins, talk with your health care provider about different options. °· Include at least 1 serving of protein with your meals and snacks (such as meats or poultry, beans, nuts, eggs, or yogurt). Try eating a protein-rich snack before bed (such as cheese and crackers or a half turkey or peanut butter sandwich). °WHAT THINGS SHOULD I AVOID TO REDUCE MY SYMPTOMS? °The following things may help reduce your symptoms: °· Avoid foods with strong smells. Try eating meals in well-ventilated areas that are free of odors. °· Avoid drinking water or other beverages with meals. Try not to drink anything less than 30 minutes before and after meals. °· Avoid drinking more than 1 cup of fluid at a time. °· Avoid fried or high-fat foods, such as butter and cream sauces. °· Avoid spicy foods. °· Avoid skipping meals the best you can. Nausea can be more intense on an empty stomach. If you cannot tolerate food at that time, do not force it. Try sucking on ice chips or other frozen items and make up the calories later. °· Avoid lying down within 2 hours after eating. °Document Released: 07/05/2007 Document Revised: 09/12/2013 Document Reviewed: 07/12/2013 °ExitCare® Patient Information ©2015 [employer]. This information is not intended to replace advice given to you by your health care provider. Make sure you discuss any questions you have with your health care provider. ° °

## 2014-04-12 NOTE — Progress Notes (Signed)
Nutrition Asked by Daphane Shepherd. Galloway RN to speak to patient about diet for hyperemesis. Pt has been provided with written instructions on diet for hyperemesis by Daphane Shepherd Galloway RN. I reviewed strategies for reducing nausea over the phone ( small frequent meals of bland  low fat foods) with pt who verbalized understanding.   Elisabeth CaraKatherine Belmont Valli M.Odis LusterEd. R.D. LDN Neonatal Nutrition Support Specialist/RD III Pager [phone]

## 2014-04-12 NOTE — Discharge Summary (Signed)
Physician Discharge Summary  Patient ID: Cynthia [name] MRN: [id number] DOB/AGE: 14-Jun-1991 22 y.o.  Admit date: 04/10/2014 Discharge date: 04/12/2014  Active Problems:  Hyperemesis gravidarum before end of [redacted] week gestation, dehydration  Discharged Condition: Stable  Brief Hospital Course: Patient was admitted with HEG.  She was treated with IV hydration and antiemetics (Zofran, Pepcid, Phenergan and Robinul) and did not have any emesis while admitted.  Tolerated small amount of regular diet.  No other complications while admitted.  U/S did show a 10 week SIUP.  Labs showed low TSH, but normal free T3 and T4.  She was deemed stable for discharge to home.  Significant Diagnostic Studies:  Results for orders placed during the hospital encounter of 04/10/14 (from the past 72 hour(s))  URINALYSIS, ROUTINE W REFLEX MICROSCOPIC     Status: Abnormal   Collection Time    04/10/14  4:50 PM      Result Value Ref Range   Color, Urine AMBER (*) YELLOW   Comment: BIOCHEMICALS MAY BE AFFECTED BY COLOR   APPearance CLEAR  CLEAR   Specific Gravity, Urine 1.020  1.005 - 1.030   pH 7.0  5.0 - 8.0   Glucose, UA NEGATIVE  NEGATIVE mg/dL   Hgb urine dipstick NEGATIVE  NEGATIVE   Bilirubin Urine SMALL (*) NEGATIVE   Ketones, ur >80 (*) NEGATIVE mg/dL   Protein, ur 100 (*) NEGATIVE mg/dL   Urobilinogen, UA 4.0 (*) 0.0 - 1.0 mg/dL   Nitrite NEGATIVE  NEGATIVE   Leukocytes, UA NEGATIVE  NEGATIVE  URINE MICROSCOPIC-ADD ON     Status: Abnormal   Collection Time    04/10/14  4:50 PM      Result Value Ref Range   Squamous Epithelial / LPF MANY (*) RARE   WBC, UA 7-10  <3 WBC/hpf   RBC / HPF 0-2  <3 RBC/hpf   Bacteria, UA MANY (*) RARE   Urine-Other MUCOUS PRESENT    CBC     Status: Abnormal   Collection Time    04/10/14  5:40 PM      Result Value Ref Range   WBC 16.5 (*) 4.0 - 10.5 K/uL   RBC 4.51  3.87 - 5.11 MIL/uL   Hemoglobin 12.8  12.0 - 15.0 g/dL   HCT 35.3 (*) 36.0 - 46.0 %   MCV 78.3   78.0 - 100.0 fL   MCH 28.4  26.0 - 34.0 pg   MCHC 36.3 (*) 30.0 - 36.0 g/dL   RDW 14.8  11.5 - 15.5 %   Platelets 212  150 - 400 K/uL  COMPREHENSIVE METABOLIC PANEL     Status: Abnormal   Collection Time    04/10/14  5:40 PM      Result Value Ref Range   Sodium 141  137 - 147 mEq/L   Potassium 4.1  3.7 - 5.3 mEq/L   Chloride 102  96 - 112 mEq/L   CO2 24  19 - 32 mEq/L   Glucose, Bld 96  70 - 99 mg/dL   BUN 10  6 - 23 mg/dL   Creatinine, Ser 0.62  0.50 - 1.10 mg/dL   Calcium 10.1  8.4 - 10.5 mg/dL   Total Protein 7.8  6.0 - 8.3 g/dL   Albumin 4.0  3.5 - 5.2 g/dL   AST 22  0 - 37 U/L   ALT 14  0 - 35 U/L   Alkaline Phosphatase 36 (*) 39 - 117 U/L   Total Bilirubin  0.8  0.3 - 1.2 mg/dL   GFR calc non Af Amer >90  >90 mL/min   GFR calc Af Amer >90  >90 mL/min   Comment: (NOTE)     The eGFR has been calculated using the CKD EPI equation.     This calculation has not been validated in all clinical situations.     eGFR's persistently <90 mL/min signify possible Chronic Kidney     Disease.   Anion gap 15  5 - 15  LIPASE, BLOOD     Status: None   Collection Time    04/10/14  5:40 PM      Result Value Ref Range   Lipase 44  11 - 59 U/L   Comment: Performed at [facility]     Status: Abnormal   Collection Time    04/10/14  5:40 PM      Result Value Ref Range   Amylase 137 (*) 0 - 105 U/L   Comment: Performed at [facility]  TSH     Status: Abnormal   Collection Time    04/10/14  5:40 PM      Result Value Ref Range   TSH 0.014 (*) 0.350 - 4.500 uIU/mL   Comment: Performed at [place] ABS-IGG+IGA, BLD     Status: None   Collection Time    04/11/14 10:50 AM      Result Value Ref Range   H Pylori IgA 4.8  <9.0 U/mL   Comment: (NOTE)     Result        Interpretation     ---------     -----------------------------------------------     <9.0          Negative     9.0-11.0      Equivocal     >11.0         Positive      H. pylori IgA antibody results should be interpreted in conjunction     with H. pylori IgG for detection of antibodies to H. pylori.   H Pylori IgG <0.40     Comment: (NOTE)     No significant level of IgG antibody to H. pylori detected.       ISR = Immune Status Ratio                 <0.90         ISR       Negative                 0.90 - 1.09   ISR       Equivocal                 >=1.10        ISR       Positive     The above results were obtained with the Immulite 2000 H. pylori IgG     EIA.  Results obtained from other manufacturer's assay methods may not     be used interchangeably.     Performed at [facility]  T3, FREE     Status: None   Collection Time    04/11/14 10:50 AM      Result Value Ref Range   T3, Free 4.1  2.3 - 4.2 pg/mL   Comment: Performed at [facility]  T4, FREE     Status: Abnormal   Collection Time    04/11/14 10:50 AM  Result Value Ref Range   Free T4 1.83 (*) 0.80 - 1.80 ng/dL   Comment: Performed at [facility]    04/10/2014     OBSTETRIC <14 WK ULTRASOUND CLINICAL DATA:  Nausea vomiting hyperemesis TECHNIQUE: Transabdominal ultrasound was performed for evaluation of the gestation as well as the maternal uterus and adnexal regions.  COMPARISON:  None.  FINDINGS: Intrauterine gestational sac: Visualized/normal in shape.  Yolk sac:  Present  Embryo:  Present  Cardiac Activity: Visualized  Heart Rate: 176 bpm  CRL:   36  mm   10 w  [place] EDC: 11/02/2013  Maternal uterus/adnexae: Ovaries are normal. No subchorionic hemorrhage or free fluid.  IMPRESSION: Live intrauterine gestation   Electronically Signed   By: Skipper Cliche M.D.   On: 04/10/2014 21:21    Treatments: IV hydration and antiemetics  Discharge Exam: Blood pressure 137/90, pulse 78, temperature 99.1 F (37.3 C), temperature source Oral, resp. rate 18, height '4\' 11"'  (1.499 m), weight 126 lb (57.153 kg), last menstrual period 02/02/2014, SpO2 100.00%. General appearance:  alert and no distress Resp: clear to auscultation bilaterally Cardio: regular rate and rhythm GI: soft, non-tender; bowel sounds normal; no masses,  no organomegaly Pelvic: deferred Extremities: extremities normal, atraumatic, no cyanosis or edema and Homans sign is negative, no sign of DVT  Disposition: 01-Home or Self Care     Medication List         famotidine 20 MG tablet  Commonly known as:  PEPCID  Take 1 tablet (20 mg total) by mouth 2 (two) times daily.     glycopyrrolate 1 MG tablet  Commonly known as:  ROBINUL  Take 1-2 tablets (1-2 mg total) by mouth 3 (three) times daily as needed (Ptyalism).     metoCLOPramide 10 MG tablet  Commonly known as:  REGLAN  Take 1 tablet (10 mg total) by mouth every 6 (six) hours.     ondansetron 4 MG disintegrating tablet  Commonly known as:  ZOFRAN ODT  Take 1 tablet (4 mg total) by mouth every 8 (eight) hours as needed for nausea or vomiting.     ondansetron 4 MG tablet  Commonly known as:  ZOFRAN  Take 1 tablet (4 mg total) by mouth every 6 (six) hours.     prenatal multivitamin Tabs tablet  Take 1 tablet by mouth daily at 12 noon.     promethazine 25 MG tablet  Commonly known as:  PHENERGAN  Take 1 tablet (25 mg total) by mouth every 6 (six) hours as needed for nausea or vomiting.           Follow-up Information   Follow up with [facility] Provider. (As soon as possible)       Signed: Osborne Oman, MD 04/12/2014, 4:01 PM

## 2014-04-12 NOTE — Progress Notes (Signed)
Discharge  To home ambulated  Out with family

## 2014-04-18 ENCOUNTER — Encounter (HOSPITAL_COMMUNITY): Payer: Self-pay | Admitting: General Practice

## 2014-04-18 ENCOUNTER — Inpatient Hospital Stay (HOSPITAL_COMMUNITY)
Admission: AD | Admit: 2014-04-18 | Discharge: 2014-04-18 | Disposition: A | Discharge status: Home / Self Care | Payer: Medicaid Other | Source: Ambulatory Visit | Attending: Obstetrics & Gynecology | Admitting: Obstetrics & Gynecology

## 2014-04-18 DIAGNOSIS — O219 Vomiting of pregnancy, unspecified: Secondary | ICD-10-CM

## 2014-04-18 DIAGNOSIS — O210 Mild hyperemesis gravidarum: Secondary | ICD-10-CM | POA: Diagnosis present

## 2014-04-18 DIAGNOSIS — O99891 Other specified diseases and conditions complicating pregnancy: Secondary | ICD-10-CM | POA: Diagnosis not present

## 2014-04-18 DIAGNOSIS — O9989 Other specified diseases and conditions complicating pregnancy, childbirth and the puerperium: Secondary | ICD-10-CM

## 2014-04-18 DIAGNOSIS — G47419 Narcolepsy without cataplexy: Secondary | ICD-10-CM | POA: Diagnosis not present

## 2014-04-18 LAB — URINALYSIS, ROUTINE W REFLEX MICROSCOPIC
BILIRUBIN URINE: NEGATIVE
Glucose, UA: NEGATIVE mg/dL
Hgb urine dipstick: NEGATIVE
Ketones, ur: 40 mg/dL — AB
LEUKOCYTES UA: NEGATIVE
Nitrite: NEGATIVE
PH: 6 (ref 5.0–8.0)
PROTEIN: 100 mg/dL — AB
Specific Gravity, Urine: 1.025 (ref 1.005–1.030)
Urobilinogen, UA: 0.2 mg/dL (ref 0.0–1.0)

## 2014-04-18 LAB — CBC WITH DIFFERENTIAL/PLATELET
Basophils Absolute: 0 10*3/uL (ref 0.0–0.1)
Basophils Relative: 0 % (ref 0–1)
Eosinophils Absolute: 0 10*3/uL (ref 0.0–0.7)
Eosinophils Relative: 0 % (ref 0–5)
HCT: 40.1 % (ref 36.0–46.0)
HEMOGLOBIN: 14.7 g/dL (ref 12.0–15.0)
LYMPHS PCT: 4 % — AB (ref 12–46)
Lymphs Abs: 0.5 10*3/uL — ABNORMAL LOW (ref 0.7–4.0)
MCH: 28.1 pg (ref 26.0–34.0)
MCHC: 36.7 g/dL — ABNORMAL HIGH (ref 30.0–36.0)
MCV: 76.7 fL — ABNORMAL LOW (ref 78.0–100.0)
MONO ABS: 1 10*3/uL (ref 0.1–1.0)
MONOS PCT: 8 % (ref 3–12)
NEUTROS ABS: 11.7 10*3/uL — AB (ref 1.7–7.7)
NEUTROS PCT: 88 % — AB (ref 43–77)
Platelets: 246 10*3/uL (ref 150–400)
RBC: 5.23 MIL/uL — ABNORMAL HIGH (ref 3.87–5.11)
RDW: 15.3 % (ref 11.5–15.5)
WBC: 13.2 10*3/uL — AB (ref 4.0–10.5)

## 2014-04-18 LAB — URINE MICROSCOPIC-ADD ON

## 2014-04-18 LAB — COMPREHENSIVE METABOLIC PANEL
ALK PHOS: 40 U/L (ref 39–117)
ALT: 18 U/L (ref 0–35)
ANION GAP: 19 — AB (ref 5–15)
AST: 18 U/L (ref 0–37)
Albumin: 4.5 g/dL (ref 3.5–5.2)
BILIRUBIN TOTAL: 0.6 mg/dL (ref 0.3–1.2)
BUN: 15 mg/dL (ref 6–23)
CHLORIDE: 95 meq/L — AB (ref 96–112)
CO2: 22 mEq/L (ref 19–32)
Calcium: 11 mg/dL — ABNORMAL HIGH (ref 8.4–10.5)
Creatinine, Ser: 0.71 mg/dL (ref 0.50–1.10)
GFR calc Af Amer: >90 mL/min (ref >90)
Glucose, Bld: 105 mg/dL — ABNORMAL HIGH (ref 70–99)
POTASSIUM: 4 meq/L (ref 3.7–5.3)
Sodium: 136 mEq/L — ABNORMAL LOW (ref 137–147)
Total Protein: 9.1 g/dL — ABNORMAL HIGH (ref 6.0–8.3)

## 2014-04-18 MED ORDER — FAMOTIDINE IN NACL 20-0.9 MG/50ML-% IV SOLN
20.0000 mg | Freq: Once | INTRAVENOUS | Status: AC
  Administered 2014-04-18: 20 mg via INTRAVENOUS
  Filled 2014-04-18: qty 50

## 2014-04-18 MED ORDER — PROMETHAZINE HCL 25 MG/ML IJ SOLN
25.0000 mg | Freq: Once | INTRAVENOUS | Status: AC
  Administered 2014-04-18: 25 mg via INTRAVENOUS
  Filled 2014-04-18: qty 1

## 2014-04-18 MED ORDER — GLYCOPYRROLATE 1 MG PO TABS: 1.0000 mg | ORAL_TABLET | Freq: Three times a day (TID) | ORAL | Status: DC

## 2014-04-18 MED ORDER — GLYCOPYRROLATE 0.2 MG/ML IJ SOLN
0.1000 mg | Freq: Once | INTRAMUSCULAR | Status: AC
  Administered 2014-04-18: 18:00:00 via INTRAVENOUS
  Filled 2014-04-18: qty 0.5

## 2014-04-18 NOTE — MAU Note (Signed)
"  same thing" was feeling better for a few days then it started up again.. Can't eat, keep vomiting.

## 2014-04-18 NOTE — Discharge Instructions (Signed)
Hyperemesis Gravidarum °Hyperemesis gravidarum is a severe form of nausea and vomiting that happens during pregnancy. Hyperemesis is worse than morning sickness. It may cause you to have nausea or vomiting all day for many days. It may keep you from eating and drinking enough food and liquids. Hyperemesis usually occurs during the first half (the first 20 weeks) of pregnancy. It often goes away once a woman is in her second half of pregnancy. However, sometimes hyperemesis continues through an entire pregnancy.  °CAUSES  °The cause of this condition is not completely known but is thought to be related to changes in the body's hormones when pregnant. It could be from the high level of the pregnancy hormone or an increase in estrogen in the body.  °SIGNS AND SYMPTOMS  °· Severe nausea and vomiting. °· Nausea that does not go away. °· Vomiting that does not allow you to keep any food down. °· Weight loss and body fluid loss (dehydration). °· Having no desire to eat or not liking food you have previously enjoyed. °DIAGNOSIS  °Your health care provider will do a physical exam and ask you about your symptoms. He or she may also order blood tests and urine tests to make sure something else is not causing the problem.  °TREATMENT  °You may only need medicine to control the problem. If medicines do not control the nausea and vomiting, you will be treated in the hospital to prevent dehydration, increased acid in the blood (acidosis), weight loss, and changes in the electrolytes in your body that may harm the unborn baby (fetus). You may need IV fluids.  °HOME CARE INSTRUCTIONS  °· Only take over-the-counter or prescription medicines as directed by your health care provider. °· Try eating a couple of dry crackers or toast in the morning before getting out of bed. °· Avoid foods and smells that upset your stomach. °· Avoid fatty and spicy foods. °· Eat 5-6 small meals a day. °· Do not drink when eating meals. Drink between  meals. °· For snacks, eat high-protein foods, such as cheese. °· Eat or suck on things that have ginger in them. Ginger helps nausea. °· Avoid food preparation. The smell of food can spoil your appetite. °· Avoid iron pills and iron in your multivitamins until after 3-4 months of being pregnant. However, consult with your health care provider before stopping any prescribed iron pills. °SEEK MEDICAL CARE IF:  °· Your abdominal pain increases. °· You have a severe headache. °· You have vision problems. °· You are losing weight. °SEEK IMMEDIATE MEDICAL CARE IF:  °· You are unable to keep fluids down. °· You vomit blood. °· You have constant nausea and vomiting. °· You have excessive weakness. °· You have extreme thirst. °· You have dizziness or fainting. °· You have a fever or persistent symptoms for more than 2-3 days. °· You have a fever and your symptoms suddenly get worse. °MAKE SURE YOU:  °· Understand these instructions. °· Will watch your condition. °· Will get help right away if you are not doing well or get worse. °Document Released: 09/07/2005 Document Revised: 06/28/2013 Document Reviewed: 04/19/2013 °Exit[employer]® Patient Information ©2015 [employer]. This information is not intended to replace advice given to you by your health care provider. Make sure you discuss any questions you have with your health care provider. ° °

## 2014-04-18 NOTE — MAU Note (Signed)
Pt spitting, not throwing up

## 2014-04-18 NOTE — MAU Provider Note (Signed)
History     CSN: [id number]  Arrival date and time: 04/18/14 1642   First Provider Initiated Contact with Patient 04/18/14 1723      Chief Complaint  Patient presents with  . Emesis   HPI Cynthia [name] is a 23 y.o. [id number] at [redacted]w[redacted]d who presents to MAU today with complaint of N/V since Monday. The patient was just discharged from [facility] on 04/12/14 for hyperemesis with dehydration. She was given Rx for Zofran, Reglan and Rubinol. She states that Reglan doesn't work and last dose was a few days ago. She took Zofran today without relief. She rates her pain at 10/10 and states that it is burning in her chest from acid and vomiting. She denies abdominal pain except with emesis. She denies fever or diarrhea today.  OB History   Grav Para Term Preterm Abortions TAB SAB Ect Mult Living   5 1 1  3 3    1       Past Medical History  Diagnosis Date  . Narcolepsy   . No pertinent past medical history     Past Surgical History  Procedure Laterality Date  . Dilation and curettage of uterus      Family History  Problem Relation Age of Onset  . Hypertension Other     History  Substance Use Topics  . Smoking status: Never Smoker   . Smokeless tobacco: Never Used  . Alcohol Use: Yes     Comment: socially    Allergies: No Known Allergies  Prescriptions prior to admission  Medication Sig Dispense Refill  . metoCLOPramide (REGLAN) 10 MG tablet Take 1 tablet (10 mg total) by mouth every 6 (six) hours.  60 tablet  2  . ondansetron (ZOFRAN) 4 MG tablet Take 1 tablet (4 mg total) by mouth every 6 (six) hours.  20 tablet  0  . Prenatal Vit-Fe Fumarate-FA (PRENATAL MULTIVITAMIN) TABS tablet Take 1 tablet by mouth daily at 12 noon.  30 tablet  3    Review of Systems  Constitutional: Negative for fever and malaise/fatigue.  Gastrointestinal: Positive for heartburn, nausea and vomiting. Negative for abdominal pain and diarrhea.  Genitourinary: Negative for dysuria, urgency and frequency.        Neg - vaginal bleeding  Neurological: Positive for weakness. Negative for dizziness and loss of consciousness.   Physical Exam   Blood pressure 119/87, pulse 117, temperature 98.4 F (36.9 C), temperature source Oral, resp. rate 22, weight 111 lb 12.8 oz (50.712 kg), last menstrual period 02/02/2014, SpO2 98.00%.  Physical Exam  Constitutional: She is oriented to person, place, and time. She appears well-developed and well-nourished. No distress.  Appears uncomfortable and is spitting, no emesis  HENT:  Head: Normocephalic.  Cardiovascular: Tachycardia present.   Respiratory: Effort normal.  GI: Soft. She exhibits no distension and no mass. Bowel sounds are decreased. There is no tenderness. There is no rebound and no guarding.  Neurological: She is alert and oriented to person, place, and time.  Skin: Skin is warm and dry. No erythema.  Psychiatric: She has a normal mood and affect.   Results for orders placed during the hospital encounter of 04/18/14 (from the past 24 hour(s))  URINALYSIS, ROUTINE W REFLEX MICROSCOPIC     Status: Abnormal   Collection Time    04/18/14  5:49 PM      Result Value Ref Range   Color, Urine YELLOW  YELLOW   APPearance CLEAR  CLEAR   Specific Gravity, Urine 1.025  1.005 - 1.030   pH 6.0  5.0 - 8.0   Glucose, UA NEGATIVE  NEGATIVE mg/dL   Hgb urine dipstick NEGATIVE  NEGATIVE   Bilirubin Urine NEGATIVE  NEGATIVE   Ketones, ur 40 (*) NEGATIVE mg/dL   Protein, ur 161 (*) NEGATIVE mg/dL   Urobilinogen, UA 0.2  0.0 - 1.0 mg/dL   Nitrite NEGATIVE  NEGATIVE   Leukocytes, UA NEGATIVE  NEGATIVE  URINE MICROSCOPIC-ADD ON     Status: Abnormal   Collection Time    04/18/14  5:49 PM      Result Value Ref Range   Squamous Epithelial / LPF FEW (*) RARE   WBC, UA 0-2  <3 WBC/hpf   Urine-Other MUCOUS PRESENT    CBC WITH DIFFERENTIAL     Status: Abnormal   Collection Time    04/18/14  5:55 PM      Result Value Ref Range   WBC 13.2 (*) 4.0 - 10.5 K/uL    RBC 5.23 (*) 3.87 - 5.11 MIL/uL   Hemoglobin 14.7  12.0 - 15.0 g/dL   HCT 09.6  04.5 - 40.9 %   MCV 76.7 (*) 78.0 - 100.0 fL   MCH 28.1  26.0 - 34.0 pg   MCHC 36.7 (*) 30.0 - 36.0 g/dL   RDW 81.1  91.4 - 78.2 %   Platelets 246  150 - 400 K/uL   Neutrophils Relative % 88 (*) 43 - 77 %   Neutro Abs 11.7 (*) 1.7 - 7.7 K/uL   Lymphocytes Relative 4 (*) 12 - 46 %   Lymphs Abs 0.5 (*) 0.7 - 4.0 K/uL   Monocytes Relative 8  3 - 12 %   Monocytes Absolute 1.0  0.1 - 1.0 K/uL   Eosinophils Relative 0  0 - 5 %   Eosinophils Absolute 0.0  0.0 - 0.7 K/uL   Basophils Relative 0  0 - 1 %   Basophils Absolute 0.0  0.0 - 0.1 K/uL  COMPREHENSIVE METABOLIC PANEL     Status: Abnormal   Collection Time    04/18/14  5:55 PM      Result Value Ref Range   Sodium 136 (*) 137 - 147 mEq/L   Potassium 4.0  3.7 - 5.3 mEq/L   Chloride 95 (*) 96 - 112 mEq/L   CO2 22  19 - 32 mEq/L   Glucose, Bld 105 (*) 70 - 99 mg/dL   BUN 15  6 - 23 mg/dL   Creatinine, Ser 9.56  0.50 - 1.10 mg/dL   Calcium 21.3 (*) 8.4 - 10.5 mg/dL   Total Protein 9.1 (*) 6.0 - 8.3 g/dL   Albumin 4.5  3.5 - 5.2 g/dL   AST 18  0 - 37 U/L   ALT 18  0 - 35 U/L   Alkaline Phosphatase 40  39 - 117 U/L   Total Bilirubin 0.6  0.3 - 1.2 mg/dL   GFR calc non Af Amer >90  >90 mL/min   GFR calc Af Amer >90  >90 mL/min   Anion gap 19 (*) 5 - 15    MAU Course  Procedures None  MDM 25 mg IV Phenergan infusion with LR, 20 mg Prilosec and 10 mg Rubinol given IV 2000 - Patient receiving IV fluids. Patient care turned over to [facility], CNM 2026: IV fluids are done. Patient reports feeling much better now. She has not vomited since being here (>3 hours)   Freddi Starr, PA-C  04/18/2014, 7:54  PM  Assessment and Plan   1. Nausea/vomiting in pregnancy    Continue with meds currently prescribed Will add robinul to medications Return to MAU as needed  Follow-up Information   [facility] (As scheduled)     Specialty:  Obstetrics and Gynecology   Contact information:   [address] [place] [id number] [phone]

## 2014-04-19 NOTE — MAU Provider Note (Signed)
Attestation of Attending Supervision of Advanced Practitioner (PA/CNM/NP): Evaluation and management procedures were performed by the Advanced Practitioner under my supervision and collaboration.  I have reviewed the Advanced Practitioner's note and chart, and I agree with the management and plan.  Tenee Wish, MD, FACOG Attending Obstetrician & Gynecologist Faculty Practice, [facility]   

## 2014-05-03 ENCOUNTER — Encounter (HOSPITAL_COMMUNITY): Payer: Self-pay | Admitting: *Deleted

## 2014-05-03 ENCOUNTER — Inpatient Hospital Stay (HOSPITAL_COMMUNITY)
Admission: AD | Admit: 2014-05-03 | Discharge: 2014-05-03 | Disposition: A | Discharge status: Home / Self Care | Payer: Medicaid Other | Source: Ambulatory Visit | Attending: Obstetrics & Gynecology | Admitting: Obstetrics & Gynecology

## 2014-05-03 DIAGNOSIS — O210 Mild hyperemesis gravidarum: Secondary | ICD-10-CM | POA: Insufficient documentation

## 2014-05-03 DIAGNOSIS — O219 Vomiting of pregnancy, unspecified: Secondary | ICD-10-CM

## 2014-05-03 LAB — CBC
HEMATOCRIT: 35.2 % — AB (ref 36.0–46.0)
Hemoglobin: 13 g/dL (ref 12.0–15.0)
MCH: 28.4 pg (ref 26.0–34.0)
MCHC: 36.9 g/dL — ABNORMAL HIGH (ref 30.0–36.0)
MCV: 76.9 fL — ABNORMAL LOW (ref 78.0–100.0)
Platelets: 237 10*3/uL (ref 150–400)
RBC: 4.58 MIL/uL (ref 3.87–5.11)
RDW: 15.6 % — ABNORMAL HIGH (ref 11.5–15.5)
WBC: 15.5 10*3/uL — AB (ref 4.0–10.5)

## 2014-05-03 LAB — COMPREHENSIVE METABOLIC PANEL
ALT: 22 U/L (ref 0–35)
ANION GAP: 16 — AB (ref 5–15)
AST: 28 U/L (ref 0–37)
Albumin: 3.7 g/dL (ref 3.5–5.2)
Alkaline Phosphatase: 36 U/L — ABNORMAL LOW (ref 39–117)
BILIRUBIN TOTAL: 0.5 mg/dL (ref 0.3–1.2)
BUN: 9 mg/dL (ref 6–23)
CHLORIDE: 101 meq/L (ref 96–112)
CO2: 21 meq/L (ref 19–32)
Calcium: 10.3 mg/dL (ref 8.4–10.5)
Creatinine, Ser: 0.56 mg/dL (ref 0.50–1.10)
Glucose, Bld: 100 mg/dL — ABNORMAL HIGH (ref 70–99)
Potassium: 4.5 mEq/L (ref 3.7–5.3)
SODIUM: 138 meq/L (ref 137–147)
TOTAL PROTEIN: 7.8 g/dL (ref 6.0–8.3)

## 2014-05-03 LAB — URINALYSIS, ROUTINE W REFLEX MICROSCOPIC
Bilirubin Urine: NEGATIVE
GLUCOSE, UA: NEGATIVE mg/dL
Hgb urine dipstick: NEGATIVE
Leukocytes, UA: NEGATIVE
NITRITE: NEGATIVE
PH: 6.5 (ref 5.0–8.0)
Protein, ur: NEGATIVE mg/dL
Specific Gravity, Urine: 1.02 (ref 1.005–1.030)
Urobilinogen, UA: 1 mg/dL (ref 0.0–1.0)

## 2014-05-03 MED ORDER — GLYCOPYRROLATE 0.2 MG/ML IJ SOLN
0.2000 mg | Freq: Once | INTRAMUSCULAR | Status: AC
  Administered 2014-05-03: 0.2 mg via INTRAVENOUS
  Filled 2014-05-03: qty 1

## 2014-05-03 MED ORDER — DEXTROSE IN LACTATED RINGERS 5 % IV SOLN
Freq: Once | INTRAVENOUS | Status: AC
  Administered 2014-05-03: 20:00:00 via INTRAVENOUS

## 2014-05-03 MED ORDER — PANTOPRAZOLE SODIUM 40 MG IV SOLR
40.0000 mg | Freq: Once | INTRAVENOUS | Status: AC
  Administered 2014-05-03: 40 mg via INTRAVENOUS
  Filled 2014-05-03: qty 40

## 2014-05-03 MED ORDER — ONDANSETRON HCL 4 MG/2ML IJ SOLN
4.0000 mg | Freq: Once | INTRAMUSCULAR | Status: AC
  Administered 2014-05-03: 4 mg via INTRAVENOUS
  Filled 2014-05-03: qty 2

## 2014-05-03 MED ORDER — SODIUM CHLORIDE 0.9 % IV SOLN
25.0000 mg | Freq: Once | INTRAVENOUS | Status: AC
  Administered 2014-05-03: 25 mg via INTRAVENOUS
  Filled 2014-05-03: qty 1

## 2014-05-03 NOTE — Discharge Instructions (Signed)
Eating Plan for Hyperemesis Gravidarum Severe cases of hyperemesis gravidarum can lead to dehydration and malnutrition. The hyperemesis eating plan is one way to lessen the symptoms of nausea and vomiting. It is often used with prescribed medicines to control your symptoms.  WHAT CAN I DO TO RELIEVE MY SYMPTOMS? Listen to your body. Everyone is different and has different preferences. Find what works best for you. Some of the following things may help:  Eat and drink slowly.  Eat 5-6 small meals daily instead of 3 large meals.   Eat crackers before you get out of bed in the morning.   Starchy foods are usually well tolerated (such as cereal, toast, bread, potatoes, pasta, rice, and pretzels).   Ginger may help with nausea. Add  tsp ground ginger to hot tea or choose ginger tea.   Try drinking 100% fruit juice or an electrolyte drink.  Continue to take your prenatal vitamins as directed by your health care provider. If you are having trouble taking your prenatal vitamins, talk with your health care provider about different options.  Include at least 1 serving of protein with your meals and snacks (such as meats or poultry, beans, nuts, eggs, or yogurt). Try eating a protein-rich snack before bed (such as cheese and crackers or a half [place] or peanut butter sandwich). WHAT THINGS SHOULD I AVOID TO REDUCE MY SYMPTOMS? The following things may help reduce your symptoms:  Avoid foods with strong smells. Try eating meals in well-ventilated areas that are free of odors.  Avoid drinking water or other beverages with meals. Try not to drink anything less than 30 minutes before and after meals.  Avoid drinking more than 1 cup of fluid at a time.  Avoid fried or high-fat foods, such as butter and cream sauces.  Avoid spicy foods.  Avoid skipping meals the best you can. Nausea can be more intense on an empty stomach. If you cannot tolerate food at that time, do not force it. Try sucking on  ice chips or other frozen items and make up the calories later.  Avoid lying down within 2 hours after eating. Document Released: 07/05/2007 Document Revised: 09/12/2013 Document Reviewed: 07/12/2013 [facility] Patient Information 2015 [place], [place]. This information is not intended to replace advice given to you by your health care provider. Make sure you discuss any questions you have with your health care provider.  Hyperemesis Gravidarum Hyperemesis gravidarum is a severe form of nausea and vomiting that happens during pregnancy. Hyperemesis is worse than morning sickness. It may cause you to have nausea or vomiting all day for many days. It may keep you from eating and drinking enough food and liquids. Hyperemesis usually occurs during the first half (the first 20 weeks) of pregnancy. It often goes away once a woman is in her second half of pregnancy. However, sometimes hyperemesis continues through an entire pregnancy.  CAUSES  The cause of this condition is not completely known but is thought to be related to changes in the body's hormones when pregnant. It could be from the high level of the pregnancy hormone or an increase in estrogen in the body.  SIGNS AND SYMPTOMS   Severe nausea and vomiting.  Nausea that does not go away.  Vomiting that does not allow you to keep any food down.  Weight loss and body fluid loss (dehydration).  Having no desire to eat or not liking food you have previously enjoyed. DIAGNOSIS  Your health care provider will do a physical exam  and ask you about your symptoms. He or she may also order blood tests and urine tests to make sure something else is not causing the problem.  °TREATMENT  °You may only need medicine to control the problem. If medicines do not control the nausea and vomiting, you will be treated in the hospital to prevent dehydration, increased acid in the blood (acidosis), weight loss, and changes in the electrolytes in your body that may harm  the unborn baby (fetus). You may need IV fluids.  °HOME CARE INSTRUCTIONS  °· Only take over-the-counter or prescription medicines as directed by your health care provider. °· Try eating a couple of dry crackers or toast in the morning before getting out of bed. °· Avoid foods and smells that upset your stomach. °· Avoid fatty and spicy foods. °· Eat 5-6 small meals a day. °· Do not drink when eating meals. Drink between meals. °· For snacks, eat high-protein foods, such as cheese. °· Eat or suck on things that have ginger in them. Ginger helps nausea. °· Avoid food preparation. The smell of food can spoil your appetite. °· Avoid iron pills and iron in your multivitamins until after 3-4 months of being pregnant. However, consult with your health care provider before stopping any prescribed iron pills. °SEEK MEDICAL CARE IF:  °· Your abdominal pain increases. °· You have a severe headache. °· You have vision problems. °· You are losing weight. °SEEK IMMEDIATE MEDICAL CARE IF:  °· You are unable to keep fluids down. °· You vomit blood. °· You have constant nausea and vomiting. °· You have excessive weakness. °· You have extreme thirst. °· You have dizziness or fainting. °· You have a fever or persistent symptoms for more than 2-3 days. °· You have a fever and your symptoms suddenly get worse. °MAKE SURE YOU:  °· Understand these instructions. °· Will watch your condition. °· Will get help right away if you are not doing well or get worse. °Document Released: 09/07/2005 Document Revised: 06/28/2013 Document Reviewed: 04/19/2013 °[employer]® Patient Information ©2015 [employer]. This information is not intended to replace advice given to you by your health care provider. Make sure you discuss any questions you have with your health care provider. ° °

## 2014-05-03 NOTE — MAU Note (Signed)
Patient presents with c/o constant nausea and vomiting since last night.  Reports at least 10 occurences of emesis.  No relief from medication.

## 2014-05-03 NOTE — MAU Note (Signed)
Threw up nausea pills and pills for heartburn

## 2014-05-03 NOTE — MAU Note (Signed)
On-stop vomiting since last night.  Been throwing up brown stuff. Pain mid chest (heartburn)

## 2014-05-03 NOTE — MAU Provider Note (Signed)
Chief Complaint: Emesis During Pregnancy   First Provider Initiated Contact with Patient 05/03/14 1905     SUBJECTIVE HPI: Cynthia [name] is a 23 y.o. [id number] at [id number] by LMP who presents with exacerbation of nausea and vomiting of pregnancy. Reports vomiting more than 10 times overnight. Has a control nausea and vomiting using Zofran the past 2 weeks, but it did not help yesterday and she could not keep anything down today. Multiple maternity admissions visits and one hospitalization for nausea vomiting this pregnancy. Reports prepregnancy weight of 127. Weighed 112 pounds on 04/08/2014 110 pounds today.   Past Medical History  Diagnosis Date  . Narcolepsy   . No pertinent past medical history    OB History  Gravida Para Term Preterm AB SAB TAB Ectopic Multiple Living  5 1 1  3  3   1     # Outcome Date GA Lbr Len/2nd Weight Sex Delivery Anes PTL Lv  5 CUR           4 TAB           3 TAB           2 TAB           1 TRM              Past Surgical History  Procedure Laterality Date  . Dilation and curettage of uterus     History   Social History  . Marital Status: Single    Spouse Name: N/A    Number of Children: 1  . Years of Education: N/A   Occupational History  . Student     [employer]   Social History Main Topics  . Smoking status: Never Smoker   . Smokeless tobacco: Never Used  . Alcohol Use: No     Comment: socially  . Drug Use: No  . Sexual Activity: Yes    Birth Control/ Protection: None   Other Topics Concern  . Not on file   Social History Narrative  . No narrative on file   No current facility-administered medications on file prior to encounter.   No current outpatient prescriptions on file prior to encounter.   No Known Allergies  ROS: Pertinent positive items in HPI. Negative for fever, chills, diarrhea, constipation, abdominal pain, vaginal bleeding or sick contacts.  OBJECTIVE Blood pressure 145/95, pulse 108, temperature 98.2 F (36.8 C),  temperature source Axillary, resp. rate 22, weight 49.896 kg (110 lb), last menstrual period 02/02/2014. GENERAL: Well-developed, well-nourished female in moderate distress. Patient trembling. HEENT: Normocephalic. Mucous membranes dry. HEART: normal rate RESP: normal effort ABDOMEN: Soft, non-tender EXTREMITIES: Nontender, no edema NEURO: Alert and oriented SPECULUM EXAM: Deferred The heart rate 166 by Doppler.  LAB RESULTS Results for orders placed during the hospital encounter of 05/03/14 (from the past 24 hour(s))  URINALYSIS, ROUTINE W REFLEX MICROSCOPIC     Status: Abnormal   Collection Time    05/03/14  6:20 PM      Result Value Ref Range   Color, Urine YELLOW  YELLOW   APPearance CLEAR  CLEAR   Specific Gravity, Urine 1.020  1.005 - 1.030   pH 6.5  5.0 - 8.0   Glucose, UA NEGATIVE  NEGATIVE mg/dL   Hgb urine dipstick NEGATIVE  NEGATIVE   Bilirubin Urine NEGATIVE  NEGATIVE   Ketones, ur >80 (*) NEGATIVE mg/dL   Protein, ur NEGATIVE  NEGATIVE mg/dL   Urobilinogen, UA 1.0  0.0 - 1.0 mg/dL  Nitrite NEGATIVE  NEGATIVE   Leukocytes, UA NEGATIVE  NEGATIVE  CBC     Status: Abnormal   Collection Time    05/03/14  7:45 PM      Result Value Ref Range   WBC 15.5 (*) 4.0 - 10.5 K/uL   RBC 4.58  3.87 - 5.11 MIL/uL   Hemoglobin 13.0  12.0 - 15.0 g/dL   HCT 16.1 (*) 09.6 - 04.5 %   MCV 76.9 (*) 78.0 - 100.0 fL   MCH 28.4  26.0 - 34.0 pg   MCHC 36.9 (*) 30.0 - 36.0 g/dL   RDW 40.9 (*) 81.1 - 91.4 %   Platelets 237  150 - 400 K/uL    IMAGING NA  MAU COURSE D5LR Bolus, Zofran, Robinul, Protonix.  N/V improving, but requesting more antiemetics. Phenergan ordered.   Care of patient turned over to Joseph Berkshire, NP at 8:30 PM.  Dorathy Kinsman, CNM 05/03/2014  8:27 PM   2030 - Patient receiving IV fluids. Care assumed from [place]  MDM Patient is resting No additional episodes of emesis in MAU Able to tolerate PO in MAU tonight   A: Nausea and vomiting  in pregnancy prior to [redacted] weeks gestation  P: Discharge home Patient advised to continue previously prescribed anti-emetics Patient encouraged to increased PO hydration and advance diet as tolerated Patient advised to follow-up with Dr. Gaynell Face to start prenatal care as planned Patient may return to MAU as needed or if her condition were to change or worsen  Freddi Starr, PA-C  05/03/2014 9:25 PM

## 2014-05-04 NOTE — MAU Provider Note (Signed)
Attestation of Attending Supervision of Advanced Practitioner (CNM/NP): Evaluation and management procedures were performed by the Advanced Practitioner under my supervision and collaboration. I have reviewed the Advanced Practitioner's note and chart, and I agree with the management and plan.  LEGGETT,KELLY H. 6:24 AM   

## 2014-05-31 LAB — CYTOLOGY - PAP: Pap Smear: NEGATIVE

## 2014-07-23 ENCOUNTER — Encounter (HOSPITAL_COMMUNITY): Payer: Self-pay | Admitting: *Deleted

## 2014-07-25 LAB — OB RESULTS CONSOLE HEPATITIS B SURFACE ANTIGEN: HEP B S AG: NEGATIVE

## 2014-07-25 LAB — OB RESULTS CONSOLE HIV ANTIBODY (ROUTINE TESTING): HIV: NONREACTIVE

## 2014-07-25 LAB — OB RESULTS CONSOLE RUBELLA ANTIBODY, IGM: Rubella: IMMUNE

## 2014-09-21 NOTE — L&D Delivery Note (Signed)
Delivery Note At 1:53 PM a viable female was delivered via Vaginal, Spontaneous Delivery (Presentation: ; Occiput Anterior).  APGAR: , ; weight  .   Placenta status: Intact, Spontaneous.  Cord: 3 vessels with the following complications: None.    Anesthesia: Epidural  Episiotomy:   Lacerations: 1st degree;Perineal;Sulcus Suture Repair: 2.0 vicryl Est. Blood Loss (mL):    Mom to postpartum.  Baby to Couplet care / Skin to Skin.  Cynthia [name] A 11/07/2014, 2:12 PM

## 2014-10-09 LAB — OB RESULTS CONSOLE GBS: STREP GROUP B AG: NEGATIVE

## 2014-11-07 ENCOUNTER — Inpatient Hospital Stay (HOSPITAL_COMMUNITY): Payer: Medicaid Other | Admitting: Anesthesiology

## 2014-11-07 ENCOUNTER — Encounter (HOSPITAL_COMMUNITY): Payer: Self-pay | Admitting: *Deleted

## 2014-11-07 ENCOUNTER — Inpatient Hospital Stay (HOSPITAL_COMMUNITY)
Admission: AD | Admit: 2014-11-07 | Discharge: 2014-11-09 | DRG: 775 | Disposition: A | Discharge status: Home / Self Care | Payer: Medicaid Other | Source: Ambulatory Visit | Attending: Obstetrics | Admitting: Obstetrics

## 2014-11-07 DIAGNOSIS — O471 False labor at or after 37 completed weeks of gestation: Secondary | ICD-10-CM | POA: Diagnosis present

## 2014-11-07 DIAGNOSIS — Z3A39 39 weeks gestation of pregnancy: Secondary | ICD-10-CM | POA: Diagnosis present

## 2014-11-07 DIAGNOSIS — IMO0001 Reserved for inherently not codable concepts without codable children: Secondary | ICD-10-CM

## 2014-11-07 LAB — COMPREHENSIVE METABOLIC PANEL
ALT: 14 U/L (ref 0–35)
ANION GAP: 4 — AB (ref 5–15)
AST: 18 U/L (ref 0–37)
Albumin: 3.3 g/dL — ABNORMAL LOW (ref 3.5–5.2)
Alkaline Phosphatase: 132 U/L — ABNORMAL HIGH (ref 39–117)
BILIRUBIN TOTAL: 0.5 mg/dL (ref 0.3–1.2)
BUN: 13 mg/dL (ref 6–23)
CHLORIDE: 106 mmol/L (ref 96–112)
CO2: 23 mmol/L (ref 19–32)
Calcium: 8.8 mg/dL (ref 8.4–10.5)
Creatinine, Ser: 0.92 mg/dL (ref 0.50–1.10)
GFR calc Af Amer: >90 mL/min (ref >90)
GFR calc non Af Amer: 86 mL/min — ABNORMAL LOW (ref >90)
Glucose, Bld: 82 mg/dL (ref 70–99)
Potassium: 4 mmol/L (ref 3.5–5.1)
Sodium: 133 mmol/L — ABNORMAL LOW (ref 135–145)
Total Protein: 7.1 g/dL (ref 6.0–8.3)

## 2014-11-07 LAB — CBC
HCT: 35.3 % — ABNORMAL LOW (ref 36.0–46.0)
HEMATOCRIT: 32.9 % — AB (ref 36.0–46.0)
HEMOGLOBIN: 11.9 g/dL — AB (ref 12.0–15.0)
Hemoglobin: 12.7 g/dL (ref 12.0–15.0)
MCH: 27.5 pg (ref 26.0–34.0)
MCH: 27.6 pg (ref 26.0–34.0)
MCHC: 36 g/dL (ref 30.0–36.0)
MCHC: 36.2 g/dL — ABNORMAL HIGH (ref 30.0–36.0)
MCV: 76.4 fL — ABNORMAL LOW (ref 78.0–100.0)
MCV: 76.3 fL — ABNORMAL LOW (ref 78.0–100.0)
Platelets: 193 10*3/uL (ref 150–400)
Platelets: 179 10*3/uL (ref 150–400)
RBC: 4.62 MIL/uL (ref 3.87–5.11)
RBC: 4.31 MIL/uL (ref 3.87–5.11)
RDW: 17.1 % — AB (ref 11.5–15.5)
RDW: 17 % — ABNORMAL HIGH (ref 11.5–15.5)
WBC: 11.2 10*3/uL — ABNORMAL HIGH (ref 4.0–10.5)
WBC: 19.5 10*3/uL — AB (ref 4.0–10.5)

## 2014-11-07 LAB — MRSA PCR SCREENING: MRSA by PCR: NEGATIVE

## 2014-11-07 LAB — URIC ACID: Uric Acid, Serum: 4.7 mg/dL (ref 2.4–7.0)

## 2014-11-07 LAB — TYPE AND SCREEN
ABO/RH(D): A POS
ANTIBODY SCREEN: NEGATIVE

## 2014-11-07 LAB — LACTATE DEHYDROGENASE: LDH: 152 U/L (ref 94–250)

## 2014-11-07 MED ORDER — DIBUCAINE 1 % RE OINT: 1.0000 "application " | TOPICAL_OINTMENT | RECTAL | Status: DC | PRN

## 2014-11-07 MED ORDER — PHENYLEPHRINE 40 MCG/ML (10ML) SYRINGE FOR IV PUSH (FOR BLOOD PRESSURE SUPPORT)
80.0000 ug | PREFILLED_SYRINGE | INTRAVENOUS | Status: DC | PRN
  Filled 2014-11-07: qty 2

## 2014-11-07 MED ORDER — DIPHENHYDRAMINE HCL 25 MG PO CAPS: 25.0000 mg | ORAL_CAPSULE | Freq: Four times a day (QID) | ORAL | Status: DC | PRN

## 2014-11-07 MED ORDER — OXYTOCIN BOLUS FROM INFUSION
500.0000 mL | INTRAVENOUS | Status: DC
  Administered 2014-11-07: 500 mL via INTRAVENOUS

## 2014-11-07 MED ORDER — LACTATED RINGERS IV SOLN
500.0000 mL | Freq: Once | INTRAVENOUS | Status: AC
  Administered 2014-11-07: 250 mL via INTRAVENOUS

## 2014-11-07 MED ORDER — TETANUS-DIPHTH-ACELL PERTUSSIS 5-2.5-18.5 LF-MCG/0.5 IM SUSP
0.5000 mL | Freq: Once | INTRAMUSCULAR | Status: DC
  Filled 2014-11-07: qty 0.5

## 2014-11-07 MED ORDER — SIMETHICONE 80 MG PO CHEW
80.0000 mg | CHEWABLE_TABLET | ORAL | Status: DC | PRN
  Filled 2014-11-07: qty 1

## 2014-11-07 MED ORDER — IBUPROFEN 600 MG PO TABS
600.0000 mg | ORAL_TABLET | Freq: Four times a day (QID) | ORAL | Status: DC
  Administered 2014-11-07 – 2014-11-09 (×5): 600 mg via ORAL
  Filled 2014-11-07 (×8): qty 1

## 2014-11-07 MED ORDER — BUTORPHANOL TARTRATE 1 MG/ML IJ SOLN: 1.0000 mg | INTRAMUSCULAR | Status: DC | PRN

## 2014-11-07 MED ORDER — LACTATED RINGERS IV SOLN
INTRAVENOUS | Status: DC
  Administered 2014-11-07: 12:00:00 via INTRAVENOUS

## 2014-11-07 MED ORDER — LIDOCAINE HCL (PF) 1 % IJ SOLN
30.0000 mL | INTRAMUSCULAR | Status: DC | PRN
  Administered 2014-11-07: 30 mL via SUBCUTANEOUS
  Filled 2014-11-07: qty 30

## 2014-11-07 MED ORDER — OXYTOCIN 40 UNITS IN LACTATED RINGERS INFUSION - SIMPLE MED
62.5000 mL/h | INTRAVENOUS | Status: DC
  Filled 2014-11-07: qty 1000

## 2014-11-07 MED ORDER — OXYCODONE-ACETAMINOPHEN 5-325 MG PO TABS: 1.0000 | ORAL_TABLET | ORAL | Status: DC | PRN

## 2014-11-07 MED ORDER — EPHEDRINE 5 MG/ML INJ
10.0000 mg | INTRAVENOUS | Status: DC | PRN
  Filled 2014-11-07: qty 2

## 2014-11-07 MED ORDER — FENTANYL 2.5 MCG/ML BUPIVACAINE 1/10 % EPIDURAL INFUSION (WH - ANES)
14.0000 mL/h | INTRAMUSCULAR | Status: DC | PRN
  Administered 2014-11-07: 14 mL/h via EPIDURAL

## 2014-11-07 MED ORDER — SENNOSIDES-DOCUSATE SODIUM 8.6-50 MG PO TABS
2.0000 | ORAL_TABLET | ORAL | Status: DC
  Administered 2014-11-07 – 2014-11-09 (×2): 2 via ORAL
  Filled 2014-11-07 (×2): qty 2

## 2014-11-07 MED ORDER — ACETAMINOPHEN 325 MG PO TABS: 650.0000 mg | ORAL_TABLET | ORAL | Status: DC | PRN

## 2014-11-07 MED ORDER — LACTATED RINGERS IV SOLN: 500.0000 mL | INTRAVENOUS | Status: DC | PRN

## 2014-11-07 MED ORDER — FENTANYL 2.5 MCG/ML BUPIVACAINE 1/10 % EPIDURAL INFUSION (WH - ANES)
INTRAMUSCULAR | Status: AC
  Filled 2014-11-07: qty 125

## 2014-11-07 MED ORDER — ZOLPIDEM TARTRATE 5 MG PO TABS: 5.0000 mg | ORAL_TABLET | Freq: Every evening | ORAL | Status: DC | PRN

## 2014-11-07 MED ORDER — BENZOCAINE-MENTHOL 20-0.5 % EX AERO
1.0000 "application " | INHALATION_SPRAY | CUTANEOUS | Status: DC | PRN
  Administered 2014-11-07: 1 via TOPICAL
  Filled 2014-11-07: qty 56

## 2014-11-07 MED ORDER — LACTATED RINGERS IV SOLN
INTRAVENOUS | Status: DC
  Administered 2014-11-07 – 2014-11-08 (×2): via INTRAVENOUS

## 2014-11-07 MED ORDER — MAGNESIUM SULFATE BOLUS VIA INFUSION
4.0000 g | Freq: Once | INTRAVENOUS | Status: DC
  Filled 2014-11-07: qty 500

## 2014-11-07 MED ORDER — OXYCODONE-ACETAMINOPHEN 5-325 MG PO TABS: 2.0000 | ORAL_TABLET | ORAL | Status: DC | PRN

## 2014-11-07 MED ORDER — DIPHENHYDRAMINE HCL 50 MG/ML IJ SOLN: 12.5000 mg | INTRAMUSCULAR | Status: DC | PRN

## 2014-11-07 MED ORDER — PHENYLEPHRINE 40 MCG/ML (10ML) SYRINGE FOR IV PUSH (FOR BLOOD PRESSURE SUPPORT)
PREFILLED_SYRINGE | INTRAVENOUS | Status: AC
  Filled 2014-11-07: qty 20

## 2014-11-07 MED ORDER — LIDOCAINE HCL (PF) 1 % IJ SOLN
INTRAMUSCULAR | Status: DC | PRN
  Administered 2014-11-07 (×2): 4 mL

## 2014-11-07 MED ORDER — ONDANSETRON HCL 4 MG PO TABS
4.0000 mg | ORAL_TABLET | ORAL | Status: DC | PRN
Start: 2014-11-07 — End: 2014-11-09

## 2014-11-07 MED ORDER — CITRIC ACID-SODIUM CITRATE 334-500 MG/5ML PO SOLN
30.0000 mL | ORAL | Status: DC | PRN
  Filled 2014-11-07: qty 30

## 2014-11-07 MED ORDER — MAGNESIUM SULFATE 40 G IN LACTATED RINGERS - SIMPLE: 2.0000 g/h | INTRAVENOUS | Status: DC

## 2014-11-07 MED ORDER — FENTANYL 2.5 MCG/ML BUPIVACAINE 1/10 % EPIDURAL INFUSION (WH - ANES)
INTRAMUSCULAR | Status: DC | PRN
  Administered 2014-11-07: 14 mL/h via EPIDURAL

## 2014-11-07 MED ORDER — FERROUS SULFATE 325 (65 FE) MG PO TABS
325.0000 mg | ORAL_TABLET | Freq: Two times a day (BID) | ORAL | Status: DC
  Administered 2014-11-07 – 2014-11-08 (×3): 325 mg via ORAL
  Filled 2014-11-07 (×3): qty 1

## 2014-11-07 MED ORDER — PRENATAL MULTIVITAMIN CH
1.0000 | ORAL_TABLET | Freq: Every day | ORAL | Status: DC
Start: 2014-11-08 — End: 2014-11-09
  Administered 2014-11-08: 1 via ORAL
  Filled 2014-11-07: qty 1

## 2014-11-07 MED ORDER — MAGNESIUM SULFATE 40 G IN LACTATED RINGERS - SIMPLE
2.0000 g/h | INTRAVENOUS | Status: DC
  Administered 2014-11-07: 2 g/h via INTRAVENOUS
  Filled 2014-11-07: qty 500

## 2014-11-07 MED ORDER — LANOLIN HYDROUS EX OINT: TOPICAL_OINTMENT | CUTANEOUS | Status: DC | PRN

## 2014-11-07 MED ORDER — ONDANSETRON HCL 4 MG/2ML IJ SOLN: 4.0000 mg | INTRAMUSCULAR | Status: DC | PRN

## 2014-11-07 MED ORDER — WITCH HAZEL-GLYCERIN EX PADS: 1.0000 "application " | MEDICATED_PAD | CUTANEOUS | Status: DC | PRN

## 2014-11-07 MED ORDER — ONDANSETRON HCL 4 MG/2ML IJ SOLN: 4.0000 mg | Freq: Four times a day (QID) | INTRAMUSCULAR | Status: DC | PRN

## 2014-11-07 NOTE — MAU Note (Signed)
Report called to Urban GibsonBobbi Jo RN on BS. Will go to next clean room and RN will call for pt

## 2014-11-07 NOTE — Anesthesia Preprocedure Evaluation (Signed)
Anesthesia Evaluation  Patient identified by MRN, date of birth, ID band Patient awake    Reviewed: Allergy & Precautions, NPO status , Patient's Chart, lab work & pertinent test results  History of Anesthesia Complications Negative for: history of anesthetic complications  Airway Mallampati: II  TM Distance: >3 FB Neck ROM: Full    Dental no notable dental hx. (+) Dental Advisory Given   Pulmonary neg pulmonary ROS,  breath sounds clear to auscultation  Pulmonary exam normal       Cardiovascular negative cardio ROS  Rhythm:Regular Rate:Normal     Neuro/Psych narcolepsy negative neurological ROS  negative psych ROS   GI/Hepatic negative GI ROS, Neg liver ROS,   Endo/Other  negative endocrine ROS  Renal/GU negative Renal ROS  negative genitourinary   Musculoskeletal negative musculoskeletal ROS (+)   Abdominal   Peds negative pediatric ROS (+)  Hematology negative hematology ROS (+)   Anesthesia Other Findings " reports that she "fell out" after last epidural and had leg weakness, denies any current symptoms, but reports that she intermittently has leg weakness during running  Reproductive/Obstetrics (+) Pregnancy                             Anesthesia Physical Anesthesia Plan  ASA: II  Anesthesia Plan: Epidural   Post-op Pain Management:    Induction:   Airway Management Planned:   Additional Equipment:   Intra-op Plan:   Post-operative Plan:   Informed Consent: I have reviewed the patients History and Physical, chart, labs and discussed the procedure including the risks, benefits and alternatives for the proposed anesthesia with the patient or authorized representative who has indicated his/her understanding and acceptance.   Dental advisory given  Plan Discussed with: CRNA  Anesthesia Plan Comments:         Anesthesia Quick Evaluation

## 2014-11-07 NOTE — Anesthesia Procedure Notes (Signed)
Epidural Patient location during procedure: OB Start time: 11/07/2014 12:45 PM  Staffing Anesthesiologist: Karie SchwalbeJUDD, Shyah Cadmus JENNETTE Performed by: anesthesiologist   Preanesthetic Checklist Completed: patient identified, site marked, surgical consent, pre-op evaluation, timeout performed, IV checked, risks and benefits discussed and monitors and equipment checked  Epidural Patient position: sitting Prep: site prepped and draped and DuraPrep Patient monitoring: continuous pulse ox and blood pressure Approach: midline Location: L3-L4 Injection technique: LOR saline  Needle:  Needle type: Tuohy  Needle gauge: 17 G Needle length: 9 cm and 9 Needle insertion depth: 5 cm cm Catheter type: closed end flexible Catheter size: 19 Gauge Catheter at skin depth: 10 cm Test dose: negative  Assessment Events: blood not aspirated, injection not painful, no injection resistance, negative IV test and no paresthesia  Additional Notes Patient identified. Risks/Benefits/Options discussed with patient including but not limited to bleeding, infection, nerve damage, paralysis, failed block, incomplete pain control, headache, blood pressure changes, nausea, vomiting, reactions to medication both or allergic, itching and postpartum back pain. Confirmed with bedside nurse the patient's most recent platelet count. Confirmed with patient that they are not currently taking any anticoagulation, have any bleeding history or any family history of bleeding disorders. Patient expressed understanding and wished to proceed. All questions were answered. Sterile technique was used throughout the entire procedure. Please see nursing notes for vital signs. Test dose was given through epidural catheter and negative prior to continuing to dose epidural or start infusion. Warning signs of high block given to the patient including shortness of breath, tingling/numbness in hands, complete motor block, or any concerning symptoms with  instructions to call for help. Patient was given instructions on fall risk and not to get out of bed. All questions and concerns addressed with instructions to call with any issues or inadequate analgesia.

## 2014-11-07 NOTE — H&P (Signed)
This is Dr. Francoise CeoBernard Corianne Buccellato dictating the history and physical on  Cynthia [name] she's a 24 year old gravida 5 para 1031 at 39 weeks and 5 days negative GBS patient admitted in labor membranes ruptured spontaneously at 1:15 PM rapid progress she had an epidural had a normal vaginal delivery of a female Apgar 2589 she had a left sulcus tear first-degree perineal placenta was spontaneous intact Past medical history limited prenatal visits Past surgical history negative Social history negative System review negative Physical exam well-developed female post delivery HEENT negative Lungs clear to P&A Heart regular rhythm no murmurs no gallops Uterus 20 week size postpartum Pelvic as described above Extremities negative

## 2014-11-07 NOTE — MAU Note (Signed)
Pt presents complaining of contractions every 2-3 minutes since last pm. Denies vaginal bleeding or leaking of fluid. Reports good fetal movement.

## 2014-11-08 LAB — CBC
HEMATOCRIT: 28.8 % — AB (ref 36.0–46.0)
Hemoglobin: 10.6 g/dL — ABNORMAL LOW (ref 12.0–15.0)
MCH: 27.8 pg (ref 26.0–34.0)
MCHC: 36.8 g/dL — ABNORMAL HIGH (ref 30.0–36.0)
MCV: 75.6 fL — AB (ref 78.0–100.0)
Platelets: 161 10*3/uL (ref 150–400)
RBC: 3.81 MIL/uL — AB (ref 3.87–5.11)
RDW: 17 % — ABNORMAL HIGH (ref 11.5–15.5)
WBC: 12.8 10*3/uL — AB (ref 4.0–10.5)

## 2014-11-08 LAB — RPR: RPR Ser Ql: NONREACTIVE

## 2014-11-08 NOTE — Lactation Note (Signed)
This note was copied from the chart of Cynthia [name]. Lactation Consultation Note  Patient Name: Cynthia [name]: 11/08/2014 Reason for consult: Initial assessment Baby 21 hours of life. Mom reports difficulty maintaining a latch with baby at breast. LC assessed baby's suckle with gloved finger. Baby has an uncoordinated suckle. Baby chomps down, then suckles a little, then tongue-thrusts. Allowed baby to suckle LC's gloved finger until baby starting suckling rhythmically in burst. Assisted mom to hand express, and colostrum visible at right nipple. Assisted mom to latch baby to right breast in football position. Mom sleepy throughout assistance and teaching. Baby's maternal grandmother in the room, involved Gma in teaching. Both questioning if nursing compatible with mom's narcolepsy. Discussed that nursing poses the same issues as holding the baby and falling asleep. Enc Gma that she should be awake and watching MOB and infant when infant in bed with MOB. Enc Gma to let patient's RN know if she is going to leave the room.  Assisted mom to latch baby to right breast in football position. Demonstrated to mom how to hold her breast in a "teacup" hold and latch baby deeply. LC can maintain a deep latch with baby, but mom keeps falling asleep and allowing the baby's head to fall away from breast and lose deep latch. Supported mom's arm with pillows and a rolled up receiving blanket for comfort and better positioning. Baby suckled rhythmically in bursts, but mom kept losing the latch by not supporting baby's head or pulling her breast away from baby's mouth to check latch. Reiterated how to maintain a deep latch. Mom had company enter room and she is sleepy. Enc mom to nurse often and hold breast to make a breast "sandwich" to maintain a deep latch. Enc mom and Gma to keep nursing baby with cues. Demonstrated stimulation/waking techniques. Gma enc mom to give formula/bottle. Discussed  benefits of breast milk/nursing. Enc Gma to assist mom with latching baby by gently holding baby's arms out of the way of mom's nipple. Mom having difficulty compressing her breasts due to long fingernails, and falling asleep throughout breast feed.   Enc putting baby to breasts with cues, which is usually 8-12 times/24 hours. Enc mom to hand express and offer baby drops of colostrum just prior to latch, and then compress breast throughout feeding for additional milk flow.  Discussed assessment, interventions, and teaching with patient's RN, Cynthia DandyMary.  Maternal Data Has patient been taught Hand Expression?: Yes Does the patient have breastfeeding experience prior to this delivery?: No  Feeding Feeding Type: Breast Fed Length of feed: 15 min  LATCH Score/Interventions Latch: Repeated attempts needed to sustain latch, nipple held in mouth throughout feeding, stimulation needed to elicit sucking reflex. Intervention(s): Adjust position;Assist with latch;Breast compression  Audible Swallowing: None Intervention(s): Skin to skin;Hand expression Intervention(s): Skin to skin;Hand expression  Type of Nipple: Everted at rest and after stimulation (Mom's nipples have short shafts)  Comfort (Breast/Nipple): Soft / non-tender     Hold (Positioning): Assistance needed to correctly position infant at breast and maintain latch. Intervention(s): Support Pillows;Skin to skin  LATCH Score: 6  Lactation Tools Discussed/Used     Consult Status Consult Status: Follow-up [name]: 11/09/14 Follow-up type: In-patient    Cynthia [name], Cynthia [name] 11/08/2014, 11:17 AM

## 2014-11-08 NOTE — Progress Notes (Signed)
UR chart review completed.  

## 2014-11-08 NOTE — Progress Notes (Signed)
Patient ID: Cynthia [name], female   DOB: [birth year], 24 y.o.   MRN: [id number] Postpartum day one Vital signs normal She is on magnesium sulfate until 3 PM today Fundus firm Lochia moderate Legs negative doing well

## 2014-11-08 NOTE — Anesthesia Postprocedure Evaluation (Signed)
Anesthesia Post Note  Patient: Cynthia [name]  Procedure(s) Performed: * No procedures listed *  Anesthesia type: Epidural  Patient location: Mother/Baby  Post pain: Pain level controlled  Post assessment: Post-op Vital signs reviewed  Last Vitals:  Filed Vitals:   11/08/14 0825  BP:   Pulse:   Temp: 36.6 C  Resp: 18    Post vital signs: Reviewed  Level of consciousness:alert  Complications: No apparent anesthesia complications
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
## 2014-11-09 ENCOUNTER — Ambulatory Visit: Payer: Self-pay

## 2014-11-09 NOTE — Discharge Instructions (Signed)
Discharge instructions ° °· You can wash your hair °· Shower °· Eat what you want °· Drink what you want °· See me in 6 weeks °· Your ankles are going to swell more in the next 2 weeks than when pregnant °· No sex for 6 weeks ° ° °Cynthia [name] A, MD 11/09/2014 ° ° °

## 2014-11-09 NOTE — Discharge Summary (Signed)
Obstetric Discharge Summary Reason for Admission: onset of labor Prenatal Procedures: none Intrapartum Procedures: spontaneous vaginal delivery Postpartum Procedures: none Complications-Operative and Postpartum: none HEMOGLOBIN  Date Value Ref Range Status  11/08/2014 10.6* 12.0 - 15.0 g/dL Final   HCT  Date Value Ref Range Status  11/08/2014 28.8* 36.0 - 46.0 % Final    Physical Exam:  General: alert Lochia: appropriate Uterine Fundus: firm Incision: healing well DVT Evaluation: No evidence of DVT seen on physical exam.  Discharge Diagnoses: Term Pregnancy-delivered  Discharge Information: Date: 11/09/2014 Activity: pelvic rest Diet: routine Medications: Percocet Condition: stable Instructions: refer to practice specific booklet Discharge to: home Follow-up Information    Follow up with Kathreen CosierMARSHALL,BERNARD A, MD.   Specialty:  Obstetrics and Gynecology   Contact information:   [address] [place] [address] [phone]       Newborn Data: Live born female  Birth Weight: 7 lb 3.6 oz (3277 g) APGAR: 9, 9  Home with mother.  MARSHALL,BERNARD A 11/09/2014, 7:54 AM

## 2014-11-09 NOTE — Lactation Note (Signed)
This note was copied from the chart of Girl Lucciana Renaud. Lactation Consultation Note  Patient Name: Girl Lajean SaverCherish Carboni ZOXWR'UToday's Date: 11/09/2014 Reason for consult: Follow-up assessment;Infant weight loss Mom reports she tries to latch baby but baby "does not want the breast". Mom is slumped down in the bed with baby not giving feeding ques. LC assisted Mom with position change, baby starting to wake and give feeding ques, LC demonstrated breast compression and baby latched. Mom had to be reminded to keep baby close at the breast.  Mom is not independently latching baby. LC discussed with Mom some feeding options and Mom reports she plans to try and breastfeed while here in the hospital but plans to switch to formula when at home. Offered to set up DEBP for Mom to pump and possibly pump/bottle feed. Mom not interested in pumping at this time. LC advised Mom to continue to supplement with formula and to increase to 18-25 ml per guidelines per hours of age. Encouraged Mom to call if she would like assist while here in the hospital or has questions/concerns.   Maternal Data    Feeding Feeding Type: Breast Fed Length of feed: 20 min  LATCH Score/Interventions Latch: Grasps breast easily, tongue down, lips flanged, rhythmical sucking. Intervention(s): Adjust position;Assist with latch;Breast massage;Breast compression  Audible Swallowing: A few with stimulation  Type of Nipple: Everted at rest and after stimulation  Comfort (Breast/Nipple): Soft / non-tender     Hold (Positioning): Assistance needed to correctly position infant at breast and maintain latch. Intervention(s): Breastfeeding basics reviewed;Support Pillows;Position options;Skin to skin  LATCH Score: 8  Lactation Tools Discussed/Used     Consult Status Consult Status: Follow-up Date: 11/10/14 Follow-up type: In-patient    Alfred LevinsGranger, Charlisa Cham Ann 11/09/2014, 4:54 PM

## 2015-01-15 ENCOUNTER — Telehealth: Payer: Self-pay | Admitting: Internal Medicine

## 2015-01-15 NOTE — Telephone Encounter (Signed)
Pt is aware that she will need OV prior to getting Rx for adderall as her last OV with CY was 03-28-12. Pt has been scheduled to follow up with CY on Thursday 01-17-15 at 3:00pm. Nothing more needed at this time.

## 2015-01-17 ENCOUNTER — Ambulatory Visit: Payer: Medicaid Other | Admitting: Internal Medicine

## 2015-01-24 ENCOUNTER — Telehealth: Payer: Self-pay | Admitting: Internal Medicine

## 2015-01-24 NOTE — Telephone Encounter (Signed)
[name]- see what you can do for non-emergent rov

## 2015-01-24 NOTE — Telephone Encounter (Signed)
Called and spoke to pt. Pt stated she is needing a sooner appt than July. 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 Pt requesting refill in medication (per pt-no provider has been filling these since CY in 2013).   CY please advise if pt needs to be worked in sooner or if first available is ok. Thanks.   No Known Allergies  No current outpatient prescriptions on file prior to visit.   No current facility-administered medications on file prior to visit.

## 2015-01-25 NOTE — Telephone Encounter (Signed)
Pt no showed for a work in slot made for her on 01-17-15. CY how soon are you wanting patient to get in? Thanks.

## 2015-01-28 ENCOUNTER — Ambulatory Visit (INDEPENDENT_AMBULATORY_CARE_PROVIDER_SITE_OTHER): Payer: Self-pay | Admitting: Internal Medicine

## 2015-01-28 ENCOUNTER — Encounter: Payer: Self-pay | Admitting: Internal Medicine

## 2015-01-28 VITALS — BP 116/70 | HR 75 | Ht 61.0 in | Wt 159.2 lb

## 2015-01-28 DIAGNOSIS — G47419 Narcolepsy without cataplexy: Secondary | ICD-10-CM

## 2015-01-28 MED ORDER — AMPHETAMINE-DEXTROAMPHETAMINE 10 MG PO TABS: ORAL_TABLET | ORAL | Status: DC

## 2015-01-28 NOTE — Telephone Encounter (Signed)
Not going to chase her down if she was a no-show. It is up to her to contact us.

## 2015-01-28 NOTE — Telephone Encounter (Signed)
Pt scheduled for appt today at 3:30 with CY Nothing further needed.

## 2015-01-28 NOTE — Patient Instructions (Addendum)
Script for adderall 10 mg regular release as discussed  Nap when you can  Please call as needed
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
## 2015-02-18 NOTE — Assessment & Plan Note (Signed)
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 Occasional sleep paralysis. No hypnagogic hallucination recognized. Plan-emphasized his responsibility to drive safely. Discussed need for adequate sleep including naps as appropriate. Discussed risk benefit of stimulant medications. Prescription for Adderall

## 2015-04-03 ENCOUNTER — Telehealth: Payer: Self-pay | Admitting: Internal Medicine

## 2015-04-03 NOTE — Telephone Encounter (Signed)
LMTCB x 1 

## 2015-04-04 NOTE — Telephone Encounter (Signed)
LMTCB x 1 

## 2015-04-04 NOTE — Telephone Encounter (Signed)
The instructions with the prescription were to take one up to 3 times daily if needed.  Start by taking the first at breakfast, and a second around lunch time. Ok to take a third in mid afternoon if needed, but don't take so late that it interferes with sleep.

## 2015-04-04 NOTE — Telephone Encounter (Signed)
Pt states she hasn't taken it more than one a day. Please advise on how you would like for her to take it. Thanks.

## 2015-04-04 NOTE — Telephone Encounter (Signed)
Has she tried taking adderall more than once daily? If so, how was it? It will be cheaper than most alternatives, so we really need to use it to see what it can do before we try changing.

## 2015-04-04 NOTE — Telephone Encounter (Signed)
Called spoke with pt. She reports she is taking her adderall 10 mg once daily. Per sig on medication 1 three times daily as directed but reports she has not taken it like this bc she is afraid how it will make her feel. She reports with taking once daily she is still falling asleep during the day. She wants to know if another medication would be more helpful? Please advise thanks Last OV 01/28/15 No Known Allergies   Current Outpatient Prescriptions on File Prior to Visit  Medication Sig Dispense Refill  . amphetamine-dextroamphetamine (ADDERALL) 10 MG tablet 1 three times daily as directed 93 tablet 0   No current facility-administered medications on file prior to visit.

## 2015-04-05 MED ORDER — AMPHETAMINE-DEXTROAMPHET ER 15 MG PO CP24: 15.0000 mg | ORAL_CAPSULE | ORAL | Status: DC

## 2015-04-05 NOTE — Telephone Encounter (Signed)
Pt is aware of CY's recommendation. She would like to try this again. Rx has been printed and will be placed up front for pick up. Nothing further was needed.

## 2015-04-05 NOTE — Telephone Encounter (Signed)
Spoke with pt. Pt was very rude on the phone. When I advised her of CY's recommendation she said, "That's too many, I'm not taking that many. I specifically said I'm not taking that and I want something else."  CY - please advise. Thanks.

## 2015-04-05 NOTE — Telephone Encounter (Signed)
She had used Adderall XR 15 mg, one daily in the past. If she wants to go back to that we can    Rx Adderall XR 15 mg, # 31, 1 daily If she doesn't want that, then it will have to wait until she and I can discuss at next ov-- noticing that she didn't make a f/u after last ov here as planned.

## 2015-08-30 IMAGING — CR DG CHEST 1V PORT
1 series · 1 of 1 positions shown · non-contrast
Comparison: 10/29/2004 radiographs.

CLINICAL DATA: Shaking sensation with nausea and vomiting.

EXAM:
PORTABLE CHEST - 1 VIEW

[AP]
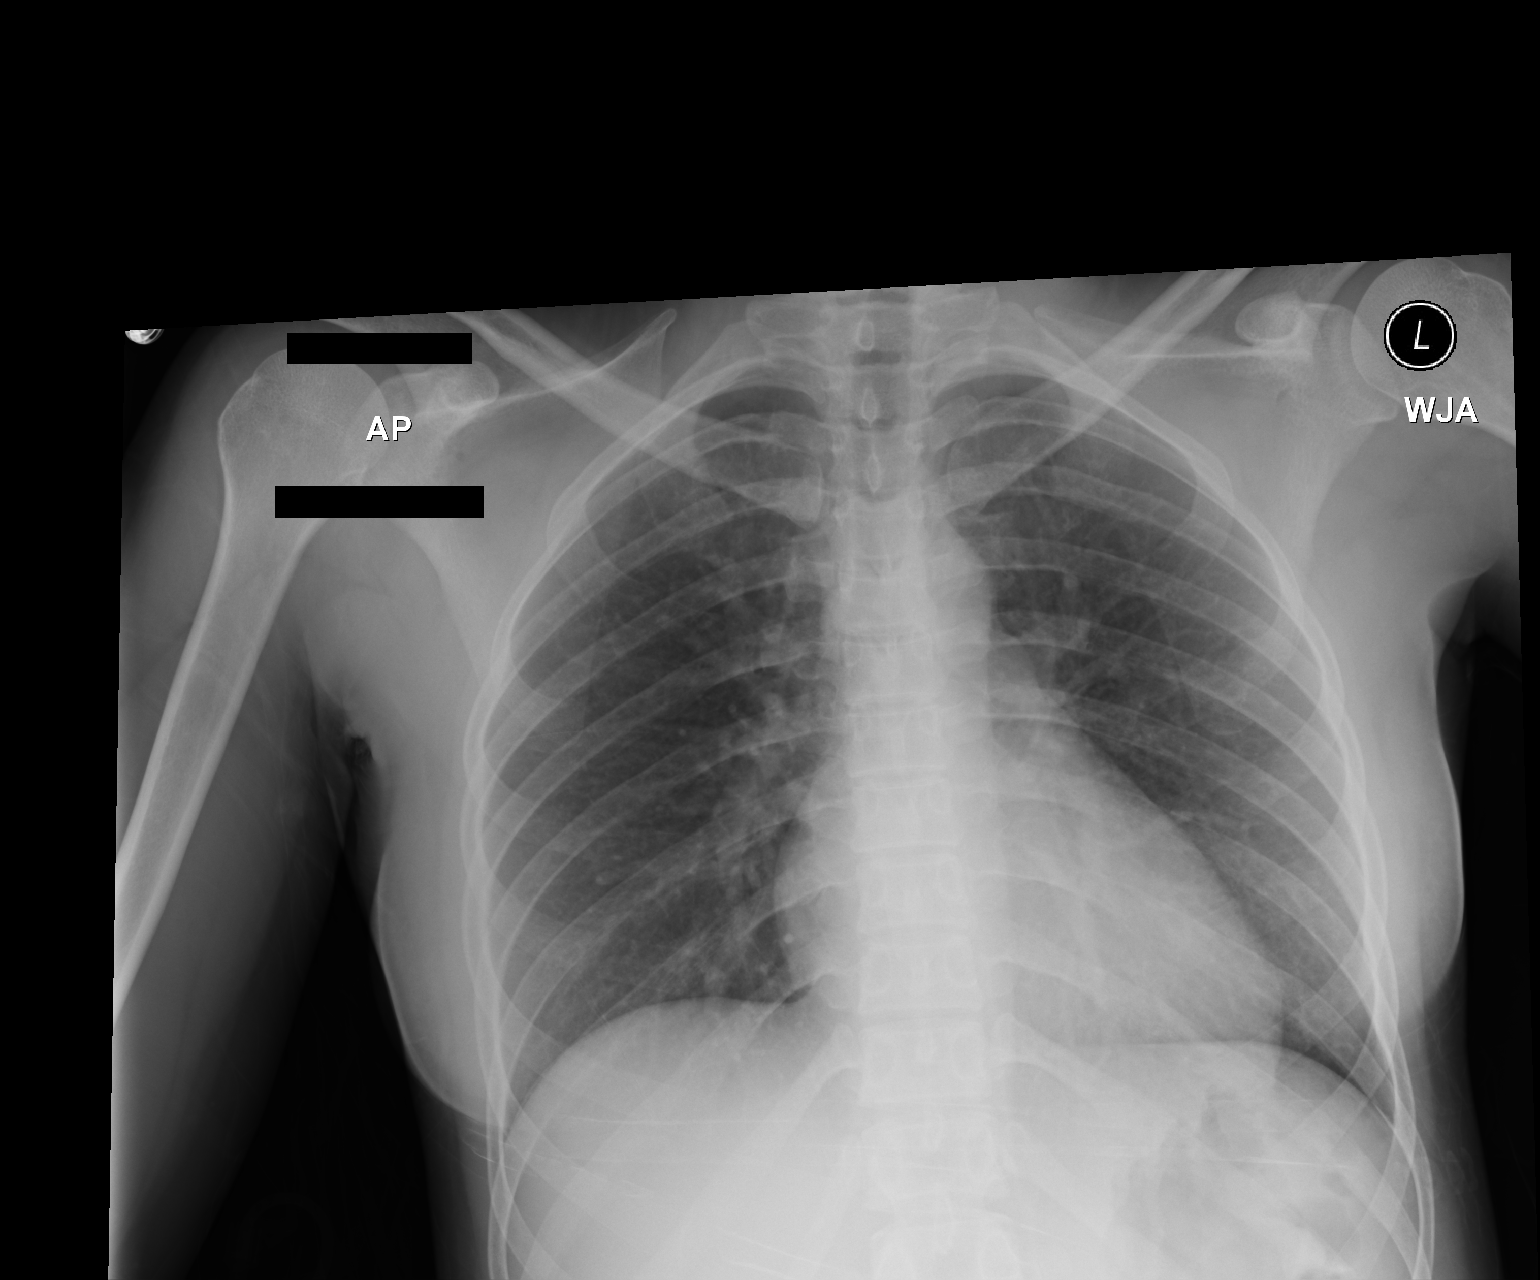

[1 of 1 positions shown; findings below may reference images not displayed]

FINDINGS: 0264 hr. The heart size and mediastinal contours are normal. The
lungs are clear. There is no pleural effusion or pneumothorax. No
acute osseous findings are identified.
IMPRESSION: No active cardiopulmonary process.

## 2015-09-01 IMAGING — US US OB COMP LESS 14 WK
1 series · 14 of 23 positions shown · non-contrast
Comparison: None.

CLINICAL DATA: Nausea vomiting hyperemesis

EXAM:
OBSTETRIC <14 WK ULTRASOUND
TECHNIQUE: Transabdominal ultrasound was performed for evaluation of the
gestation as well as the maternal uterus and adnexal regions.

[Series 1: us ob comp less 14 wks · 23 acquisitions, 14 frames shown]
[im 1/23]
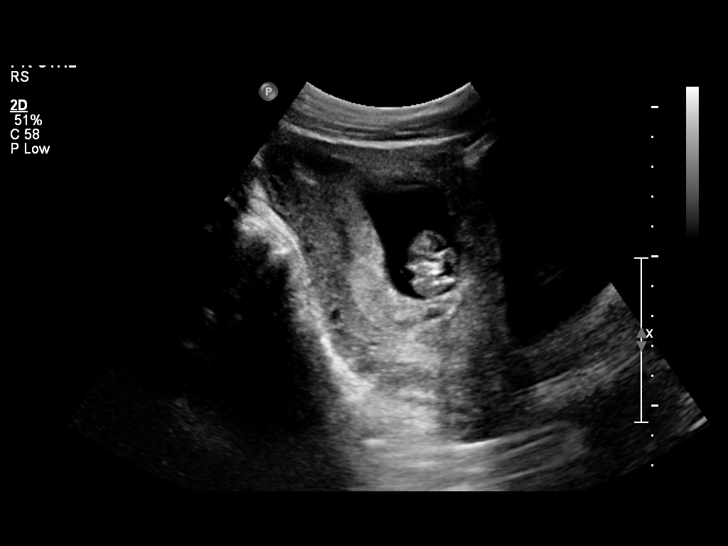
[im 3/23]
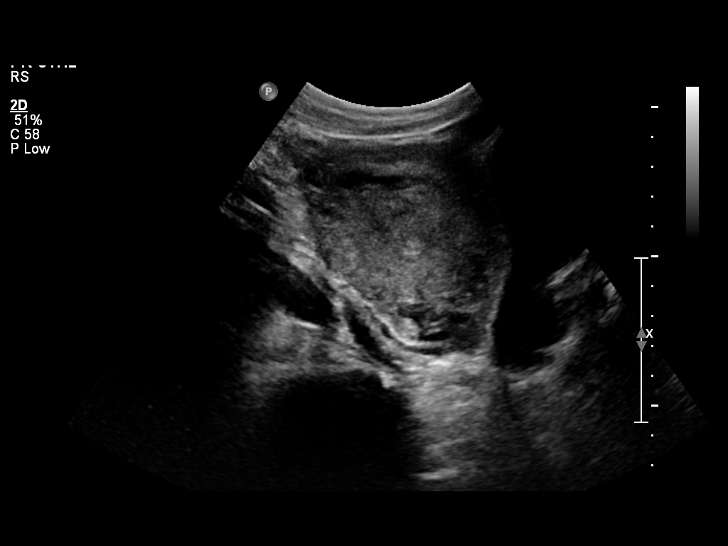
[im 5/23]
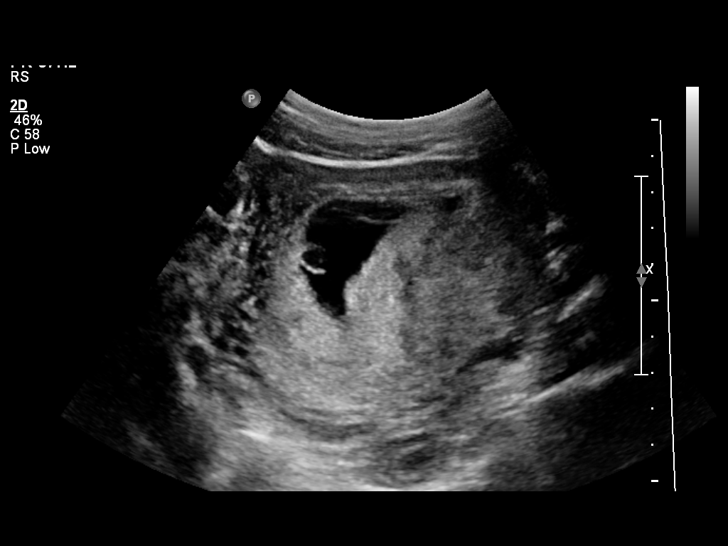
[im 6/23]
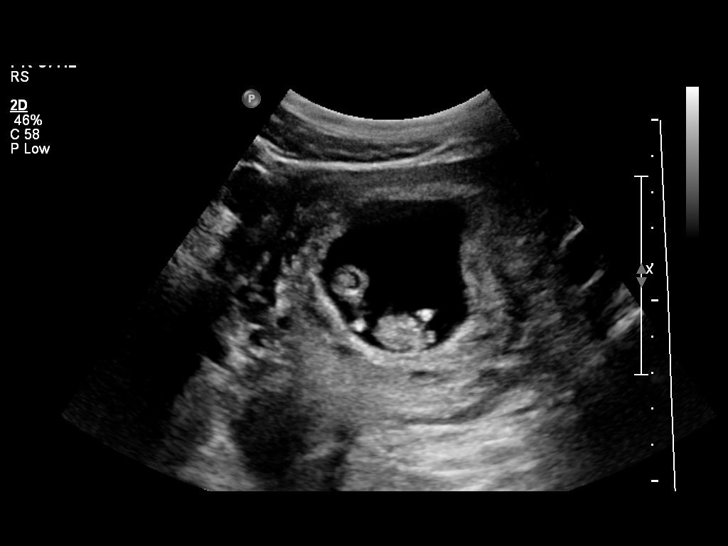
[im 8/23]
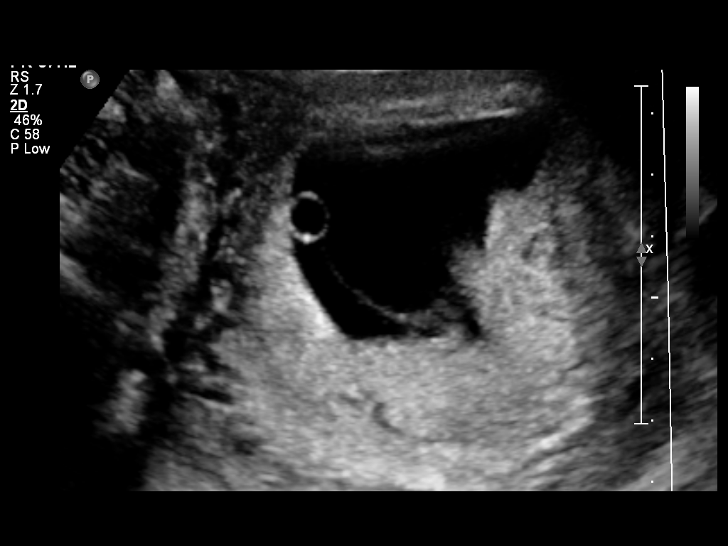
[im 10/23]
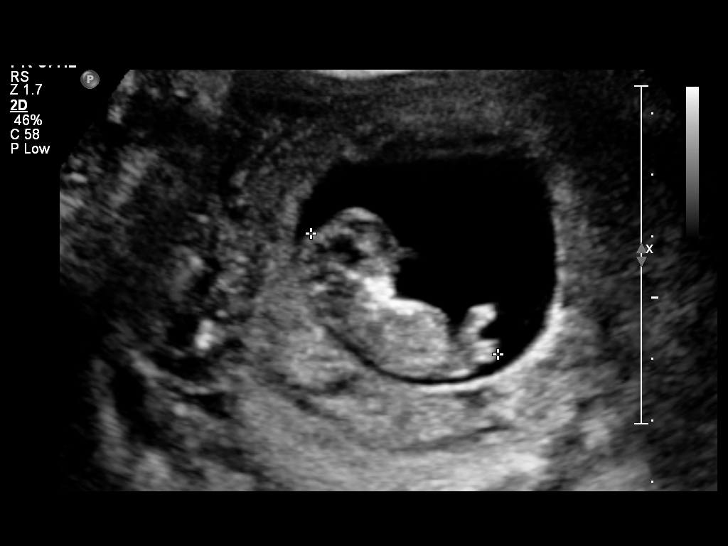
[im 11/23]
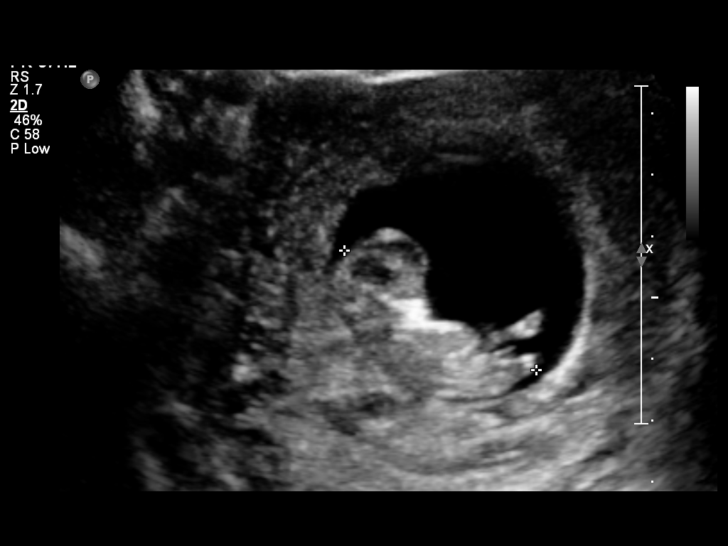
[im 13/23]
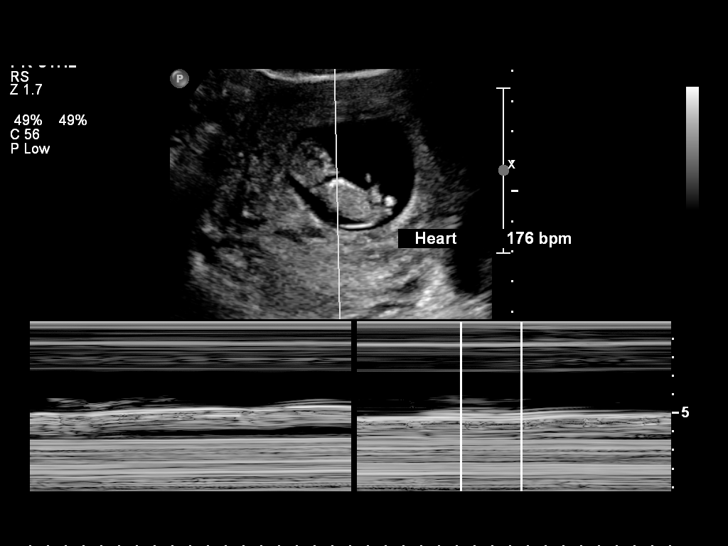
[im 14/23]
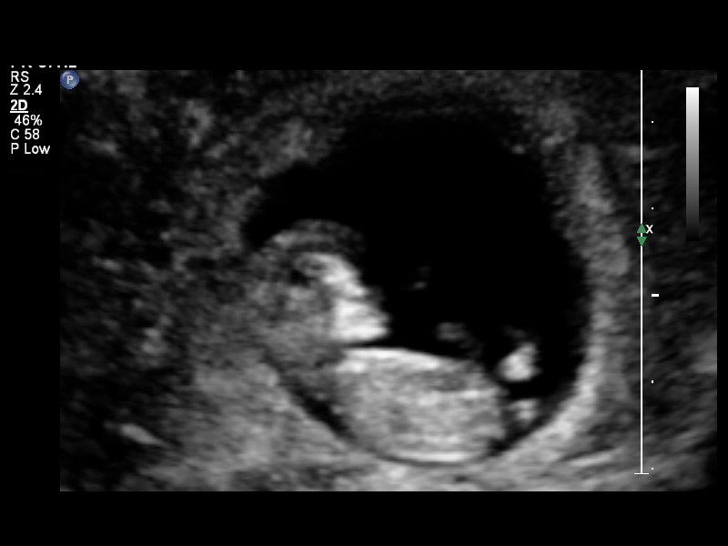
[im 16/23]
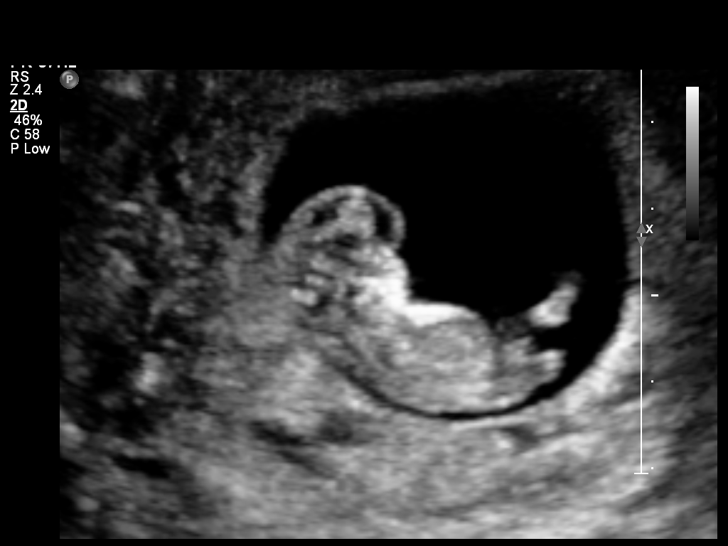
[im 18/23]
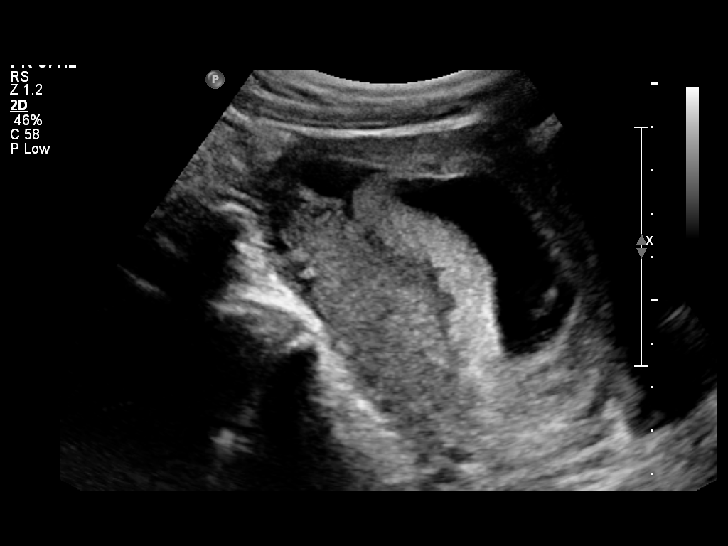
[im 19/23]
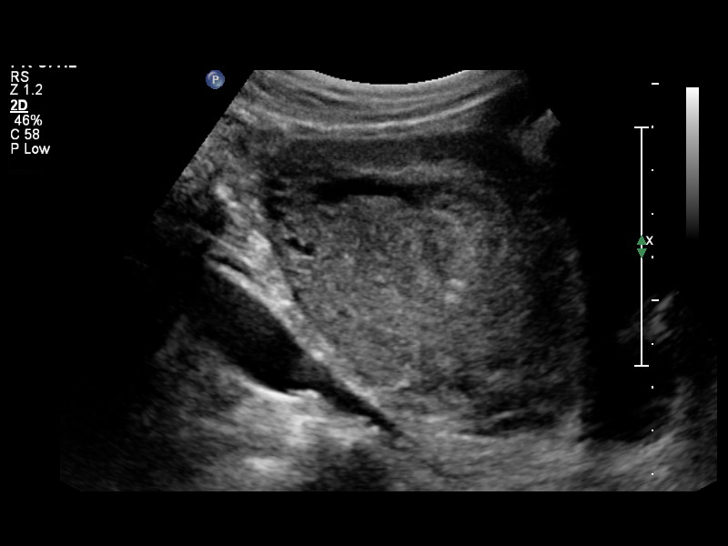
[im 21/23]
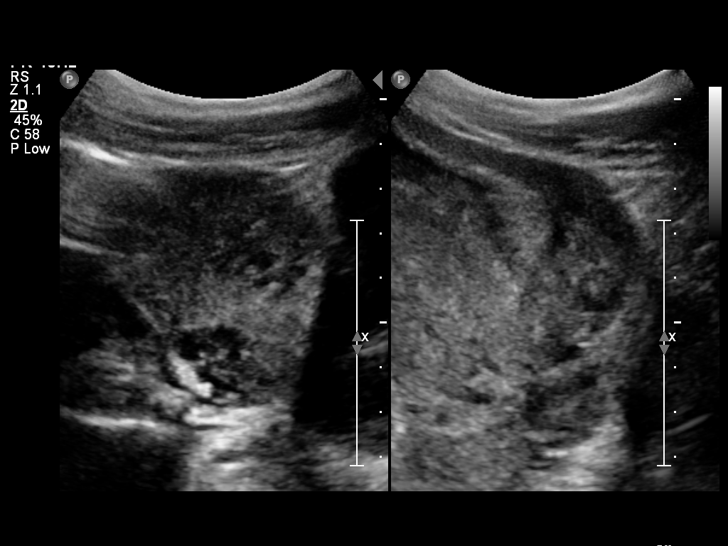
[im 23/23]
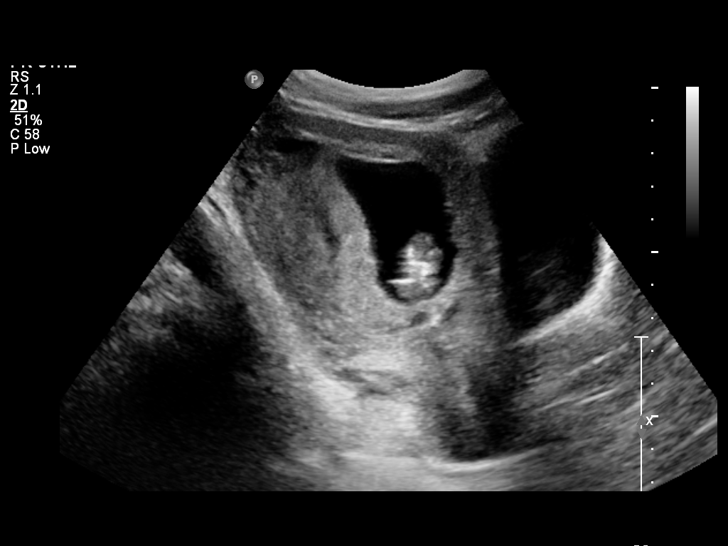

[14 of 23 positions shown; findings below may reference images not displayed]

FINDINGS: Intrauterine gestational sac: Visualized/normal in shape.

Yolk sac:  Present

Embryo:  Present

Cardiac Activity: Visualized

Heart Rate: 176 bpm

CRL:   36  mm   10 w for d                  US EDC: 11/02/2013

Maternal uterus/adnexae: Ovaries are normal. No subchorionic
hemorrhage or free fluid.
IMPRESSION: Live intrauterine gestation

## 2015-10-24 ENCOUNTER — Telehealth: Payer: Self-pay | Admitting: Internal Medicine

## 2015-10-24 NOTE — Telephone Encounter (Signed)
Pt be seen Monday 10-28-15 at 3:45pm. Thanks
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
## 2015-10-24 NOTE — Telephone Encounter (Signed)
Im sorry but these are legally controlled drugs. I will not refill without keeping return appointment.

## 2015-10-24 NOTE — Telephone Encounter (Signed)
Called and advised patient that Dr. Maple Hudson can see her on Monday.  Patient accepted appointment at 3:45pm.  Scheduled for Monday 10-28-15 at 3:45pm.  Patient aware of appointment. Nothing further needed.

## 2015-10-24 NOTE — Telephone Encounter (Signed)
Called and spoke to pt. Informed her of the recs per CY. Advised pt that CY's first available is in May 2017, pt requesting to be worked in sooner.   CY please advise.

## 2015-10-28 ENCOUNTER — Ambulatory Visit: Payer: Self-pay | Admitting: Internal Medicine

## 2015-11-27 ENCOUNTER — Ambulatory Visit: Payer: Self-pay | Admitting: Internal Medicine

## 2015-12-30 ENCOUNTER — Encounter: Payer: Self-pay | Admitting: Internal Medicine

## 2015-12-30 ENCOUNTER — Ambulatory Visit (INDEPENDENT_AMBULATORY_CARE_PROVIDER_SITE_OTHER): Payer: Self-pay | Admitting: Internal Medicine

## 2015-12-30 ENCOUNTER — Encounter (INDEPENDENT_AMBULATORY_CARE_PROVIDER_SITE_OTHER): Payer: Self-pay

## 2015-12-30 VITALS — BP 118/72 | HR 75 | Ht 61.0 in | Wt 126.8 lb

## 2015-12-30 DIAGNOSIS — G47419 Narcolepsy without cataplexy: Secondary | ICD-10-CM

## 2015-12-30 MED ORDER — AMPHETAMINE-DEXTROAMPHET ER 20 MG PO CP24: 20.0000 mg | ORAL_CAPSULE | Freq: Every day | ORAL | Status: DC
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
## 2015-12-30 NOTE — Patient Instructions (Addendum)
Script printed for Adderal 20 mg XR    1 each morning as needed  Call us a couple of days ahead when you need refills, so you have time to pick it up.  Nap when you can.  Make sure to get enough sleep at night

## 2016-01-05 NOTE — Assessment & Plan Note (Signed)
Emphasis on good sleep hygiene and adequate sleep including preference for scheduled naps rub-depending too much on stimulant medication. Plan-increase Adderall to 20 mg XR, 1 daily if needed

## 2016-02-11 ENCOUNTER — Inpatient Hospital Stay (HOSPITAL_COMMUNITY)
Admission: AD | Admit: 2016-02-11 | Discharge: 2016-02-11 | Discharge status: Against Medical Advice | Payer: Medicaid Other | Source: Ambulatory Visit | Attending: Obstetrics | Admitting: Obstetrics

## 2016-02-11 DIAGNOSIS — Z5321 Procedure and treatment not carried out due to patient leaving prior to being seen by health care provider: Secondary | ICD-10-CM | POA: Insufficient documentation

## 2016-02-11 DIAGNOSIS — N898 Other specified noninflammatory disorders of vagina: Secondary | ICD-10-CM | POA: Insufficient documentation

## 2016-02-11 LAB — URINALYSIS, ROUTINE W REFLEX MICROSCOPIC
Glucose, UA: NEGATIVE mg/dL
Hgb urine dipstick: NEGATIVE
Ketones, ur: NEGATIVE mg/dL
NITRITE: NEGATIVE
PH: 6 (ref 5.0–8.0)
Protein, ur: 30 mg/dL — AB

## 2016-02-11 LAB — URINE MICROSCOPIC-ADD ON: RBC / HPF: NONE SEEN RBC/hpf (ref 0–5)

## 2016-02-11 LAB — POCT PREGNANCY, URINE: Preg Test, Ur: NEGATIVE

## 2016-02-11 NOTE — MAU Note (Signed)
Not in lobby when called x3  

## 2016-02-11 NOTE — MAU Note (Signed)
Not in lobby when called 

## 2016-02-11 NOTE — Progress Notes (Signed)
Pt not in lobby at 2123

## 2016-02-11 NOTE — MAU Note (Signed)
Pt presents complaining of heavy discharge and a foul odor. Denies recent contact with STI's. Denies pain. Denies pregnancy.

## 2016-05-04 ENCOUNTER — Ambulatory Visit: Payer: Medicaid Other | Admitting: Internal Medicine

## 2016-05-29 ENCOUNTER — Encounter: Payer: Self-pay | Admitting: *Deleted

## 2016-11-12 ENCOUNTER — Telehealth: Payer: Self-pay | Admitting: Internal Medicine

## 2016-11-12 ENCOUNTER — Encounter: Payer: Self-pay | Admitting: Internal Medicine

## 2016-11-12 MED ORDER — AMPHETAMINE-DEXTROAMPHET ER 20 MG PO CP24: 20.0000 mg | ORAL_CAPSULE | Freq: Every day | ORAL | 0 refills | Status: DC

## 2016-11-12 NOTE — Telephone Encounter (Signed)
I think she has missed previous appointment.  We can refill this time only, and only if at the same time she is given a specific return appointment which she must keep or there will be no further refills .

## 2016-11-12 NOTE — Telephone Encounter (Signed)
Spoke with pt. She is requesting a refill on Adderall XR 20mg . Per our records we have not refilled this for her since 12/30/2015. She states that she has not been receiving this prescription from anyone else. Pt did not pickup this prescription due to the cost. Her last OV was on 12/30/15. I also advised her that she would need an OV with CY soon as well.  CY - please advise on refill. Thanks.

## 2016-11-12 NOTE — Telephone Encounter (Signed)
Called and spoke with pt and she is aware of refill per CY---I have scheduled her an appt to see CY on 11/23/16.

## 2016-11-16 ENCOUNTER — Telehealth: Payer: Self-pay | Admitting: Internal Medicine

## 2016-11-16 NOTE — Telephone Encounter (Signed)
Her prescription is up front for pick up. Spoke with pt. She is aware. Nothing further was needed.

## 2016-11-23 ENCOUNTER — Ambulatory Visit: Payer: Medicaid Other | Admitting: Internal Medicine

## 2016-12-14 ENCOUNTER — Telehealth: Payer: Self-pay | Admitting: Internal Medicine

## 2016-12-14 NOTE — Telephone Encounter (Signed)
Pt states that needs a refill of her Adderall XR 20mg  but she can only afford 10 tablets at a time since she pays out of pocket. Please advise Dr Maple HudsonYoung if you are okay with refilling this limited supply. Thanks.  Last filled 11/12/16 # 30  Allergies as of 12/14/2016   No Known Allergies     Medication List       Accurate as of 12/14/16  4:53 PM. Always use your most recent med list.          amphetamine-dextroamphetamine 20 MG 24 hr capsule Commonly known as:  ADDERALL XR Take 1 capsule (20 mg total) by mouth daily.

## 2016-12-14 NOTE — Telephone Encounter (Signed)
Spoke with pt, became very upset that we will not fill this medication.  I advised pt that she has no showed 4 of her last 5 scheduled appointments and has not been seen in almost 1 year.  Pt became hostile and spoke over me, saying she could come in now to be seen.  I advised that the office is closed and CY has no openings this week.  Pt stated "I will not take no for an answer, I need this medication".  I advised pt to contact PCP, pt states she does not have a PCP.  I advised that per her chart she sees Dr. Francoise CeoBernard Marshall, pt declined this stating that she "no longer is in business with him".    Pt is requesting that CY reconsider and requests to speak to CY directly.    CY please advise on how to proceed.  Thanks.

## 2016-12-14 NOTE — Telephone Encounter (Signed)
Sorry. As I said before. She is not keeping appointments. This is a controlled drug. No more refills without seeing one of our sleep docs. If her PCP is willing to refill this, that is up to them.

## 2016-12-15 ENCOUNTER — Encounter: Payer: Self-pay | Admitting: Adult Health

## 2016-12-15 ENCOUNTER — Ambulatory Visit (INDEPENDENT_AMBULATORY_CARE_PROVIDER_SITE_OTHER): Payer: Self-pay | Admitting: Adult Health

## 2016-12-15 DIAGNOSIS — G47419 Narcolepsy without cataplexy: Secondary | ICD-10-CM

## 2016-12-15 MED ORDER — AMPHETAMINE-DEXTROAMPHET ER 20 MG PO CP24: 20.0000 mg | ORAL_CAPSULE | Freq: Every day | ORAL | 0 refills | Status: DC

## 2016-12-15 NOTE — Telephone Encounter (Signed)
She can make appointment to establish in our usual schedule with one of our sleep docs, or she can establish with another sleep practitioner elsewhere.

## 2016-12-15 NOTE — Telephone Encounter (Signed)
Pt scheduled with TP today at 3pm and was told to arrive at 245. Pt was not very pleasant during our conversation and kept asking if there were any later appts. I advised her that CY had no appts and that this was the only available time that she could be worked in. Pt huffed and stated she would be here then abruptly ended the phone call. Nothing further needed.

## 2016-12-15 NOTE — Telephone Encounter (Signed)
Per CY-we do not have any openings; we can see if another MD has openings to take over her care all together(consult) or see if TP has opening for patient. CY will discuss with TP if patient is seen by her. Thanks.

## 2016-12-15 NOTE — Assessment & Plan Note (Signed)
Doing well on Adderrall  Pt education provided encouarged on naps.  Advised that if she is planning pregnancy this medicine needs to be stopped and is not safe for pregnancy   Plan  Patient Instructions  Script printed for Adderal 20 mg XR    1 each morning as needed  Call us a couple of days ahead when you need refills, so you have time to pick it up.  Nap when you can.  Make sure to get enough sleep at night  Follow up with Dr. Maple HudsonYoung  In 6 months and As needed

## 2016-12-15 NOTE — Telephone Encounter (Signed)
Dr. Maple HudsonYoung,  Please Advise-  Spoke with pt and gave her your message. Pt understood and states she is willing to come in and see you today or tomorrow if you can fit her in. She states she is going out of town thurs and would like to have this medication when she goes on her trip.

## 2016-12-15 NOTE — Progress Notes (Signed)
 @Patient  ID: Cynthia [name], female    DOB: 1990/11/08, 26 y.o.   MRN: [id number]  Chief Complaint  Patient presents with  . Follow-up    Referring provider: No ref. provider found  HPI: 26 yo female never smoker followed for Nacrolepsy with hypersomnia   TEST  NPSG- 10/09/09- AHI 0.2 per hour, RDI 20.4 per hour intermittent moderate snoring-  12/15/2016 Follow up : Nacrolepsy w/ hypersomnia  Pt returns for 1 year follow up . She is followed for nacrolepsy with hypersomnia . She is maintained on Adderall XR 20mg  daily.  Says she uses most days . It really helps her to stay awake w/ good attention. Helps with daytime sleepiness.  She does not have insurance, so buys them when she can afford them.  She denies pregnancy , LMP normal. Says she is not sexually active.    No Known Allergies  Immunization History  Administered Date(s) Administered  . Influenza Split 06/21/2014    Past Medical History:  Diagnosis Date  . Narcolepsy   . No pertinent past medical history     Tobacco History: History  Smoking Status  . Never Smoker  Smokeless Tobacco  . Never Used   Counseling given: Not Answered   Outpatient Encounter Prescriptions as of 12/15/2016  Medication Sig  . amphetamine-dextroamphetamine (ADDERALL XR) 20 MG 24 hr capsule Take 1 capsule (20 mg total) by mouth daily.   No facility-administered encounter medications on file as of 12/15/2016.      Review of Systems  Constitutional:   No  weight loss, night sweats,  Fevers, chills, fatigue, or  lassitude.  HEENT:   No headaches,  Difficulty swallowing,  Tooth/dental problems, or  Sore throat,                No sneezing, itching, ear ache, nasal congestion, post nasal drip,   CV:  No chest pain,  Orthopnea, PND, swelling in lower extremities, anasarca, dizziness, palpitations, syncope.   GI  No heartburn, indigestion, abdominal pain, nausea, vomiting, diarrhea, change in bowel habits, loss of appetite, bloody  stools.   Resp: No shortness of breath with exertion or at rest.  No excess mucus, no productive cough,  No non-productive cough,  No coughing up of blood.  No change in color of mucus.  No wheezing.  No chest wall deformity  Skin: no rash or lesions.  GU: no dysuria, change in color of urine, no urgency or frequency.  No flank pain, no hematuria   MS:  No joint pain or swelling.  No decreased range of motion.  No back pain.    Physical Exam  BP 112/74 (BP Location: Left Arm, Cuff Size: Normal)   Pulse 68   Ht 4\' 11"  (1.499 m)   Wt 136 lb 6.4 oz (61.9 kg)   SpO2 100%   BMI 27.55 kg/m   GEN: A/Ox3; pleasant , NAD, well nourished    HEENT:  [place]/AT,  EACs-clear, TMs-wnl, NOSE-clear, THROAT-clear, no lesions, no postnasal drip or exudate noted.   NECK:  Supple w/ fair ROM; no JVD; normal carotid impulses w/o bruits; no thyromegaly or nodules palpated; no lymphadenopathy.    RESP  Clear  P & A; w/o, wheezes/ rales/ or rhonchi. no accessory muscle use, no dullness to percussion  CARD:  RRR, no m/r/g, no peripheral edema, pulses intact, no cyanosis or clubbing.  GI:   Soft & nt; nml bowel sounds; no organomegaly or masses detected.   Musco: Warm bil, no  deformities or joint swelling noted.   Neuro: alert, no focal deficits noted.    Skin: Warm, no lesions or rashes     BNP No results found for: BNP  ProBNP No results found for: PROBNP  Imaging: No results found.   Assessment & Plan:   No problem-specific Assessment & Plan notes found for this encounter.     Rubye Oaks, NP 12/15/2016

## 2016-12-15 NOTE — Patient Instructions (Signed)
Script printed for Adderal 20 mg XR    1 each morning as needed  Call us a couple of days ahead when you need refills, so you have time to pick it up.  Nap when you can.  Make sure to get enough sleep at night  Follow up with Dr. Maple HudsonYoung  In 6 months and As needed

## 2017-01-15 ENCOUNTER — Telehealth: Payer: Self-pay | Admitting: Internal Medicine

## 2017-01-15 MED ORDER — AMPHETAMINE-DEXTROAMPHET ER 20 MG PO CP24: 20.0000 mg | ORAL_CAPSULE | Freq: Every day | ORAL | 0 refills | Status: DC

## 2017-01-15 NOTE — Telephone Encounter (Signed)
rx printed, signed by CY, and placed up front for pickup.   lmtcb X1 for pt to make aware.

## 2017-01-15 NOTE — Telephone Encounter (Signed)
Patient returned call - I advised that rx is ready for pick up -pr

## 2017-01-15 NOTE — Telephone Encounter (Signed)
Ok to refill 

## 2017-01-15 NOTE — Telephone Encounter (Signed)
CY  Please Advise-  Pt called requesting a refill of her Adderall.  Last seen: 12/15/16 with TP Last Filled 12/15/16  amphetamine-dextroamphetamine (ADDERALL XR) 20 MG 24 hr capsule [[id number]]  Order Details  Dose: 20 mg Route: Oral Frequency: Daily  Dispense Quantity:  30 capsule Refills:  0 Fills remaining:  --        Sig: Take 1 capsule (20 mg total) by mouth daily.       Written Date:  12/15/16

## 2017-02-09 ENCOUNTER — Telehealth: Payer: Self-pay | Admitting: Internal Medicine

## 2017-02-09 MED ORDER — AMPHETAMINE-DEXTROAMPHET ER 20 MG PO CP24: 20.0000 mg | ORAL_CAPSULE | Freq: Every day | ORAL | 0 refills | Status: DC

## 2017-02-09 NOTE — Telephone Encounter (Signed)
Called and spoke with pt and she is needing a refill of the adderall xr 20 mg  1 daily.  Pt would like to pick this up today.   Last seen by TP on 3/27 Last refill of adderally xr 4/27 for #30 Next ov--06/17/17 with CY  No Known Allergies

## 2017-02-09 NOTE — Telephone Encounter (Signed)
Left detailed message on pt voicemail letting her know that her Rx is ready to be picked up.  Nothing further needed.

## 2017-02-09 NOTE — Telephone Encounter (Signed)
rx has been printed out and will call pt once this has been signed and is ready to be picked up.

## 2017-02-09 NOTE — Telephone Encounter (Signed)
Ok to refill this time. Please remind her this is a controlled drug and  is meant to last a month

## 2017-03-19 ENCOUNTER — Telehealth: Payer: Self-pay | Admitting: Internal Medicine

## 2017-03-19 MED ORDER — AMPHETAMINE-DEXTROAMPHET ER 20 MG PO CP24
20.0000 mg | ORAL_CAPSULE | Freq: Every day | ORAL | 0 refills | Status: DC
Start: 2017-03-19 — End: 2017-04-27

## 2017-03-19 NOTE — Telephone Encounter (Signed)
SN has answered this message for CY.    Ok to refill the adderall for the pt. rx has been printed out and once signed I will call the pt to make her aware this is ready to be picked up.

## 2017-03-19 NOTE — Telephone Encounter (Signed)
CY please advise if ok to give refill of the adderall.  Last refill was given on 5/22.  Thanks

## 2017-03-19 NOTE — Telephone Encounter (Signed)
rx for the adderall has been signed by SN.  I have placed this rx up front and I have called and LMOMTCB x 1 for the pt to make her aware that rx is ready to be picked up.

## 2017-03-22 NOTE — Telephone Encounter (Signed)
Spoke with pt, aware of rx ready for pickup.  Nothing further needed.

## 2017-04-27 ENCOUNTER — Telehealth: Payer: Self-pay | Admitting: Internal Medicine

## 2017-04-27 MED ORDER — AMPHETAMINE-DEXTROAMPHET ER 20 MG PO CP24: 20.0000 mg | ORAL_CAPSULE | Freq: Every day | ORAL | 0 refills | Status: DC

## 2017-04-27 NOTE — Telephone Encounter (Signed)
Rx printed and placed on CY's cart. Will place in mail today.    Left message for Cynthia [name] that Rx has been placed in mail. Nothing more needed at this time.

## 2017-04-27 NOTE — Telephone Encounter (Signed)
Pt is calling and needing a refill of the adderall.  This was last filled 7/2.  She would like this mailed to her.  CY please advise if this can be printed out for the pt.  Thanks

## 2017-04-27 NOTE — Telephone Encounter (Signed)
Ok to refill 

## 2017-06-17 ENCOUNTER — Ambulatory Visit: Payer: Self-pay | Admitting: Internal Medicine

## 2017-06-25 ENCOUNTER — Emergency Department (HOSPITAL_COMMUNITY)
Admission: EM | Admit: 2017-06-25 | Discharge: 2017-06-25 | Disposition: A | Discharge status: Home / Self Care | Payer: Medicaid Other | Attending: Emergency Medicine | Admitting: Emergency Medicine

## 2017-06-25 ENCOUNTER — Encounter (HOSPITAL_COMMUNITY): Payer: Self-pay | Admitting: Emergency Medicine

## 2017-06-25 DIAGNOSIS — Z3A01 Less than 8 weeks gestation of pregnancy: Secondary | ICD-10-CM | POA: Insufficient documentation

## 2017-06-25 DIAGNOSIS — O9989 Other specified diseases and conditions complicating pregnancy, childbirth and the puerperium: Secondary | ICD-10-CM | POA: Insufficient documentation

## 2017-06-25 DIAGNOSIS — R1084 Generalized abdominal pain: Secondary | ICD-10-CM | POA: Diagnosis not present

## 2017-06-25 LAB — COMPREHENSIVE METABOLIC PANEL
ALT: 14 U/L (ref 14–54)
AST: 20 U/L (ref 15–41)
Albumin: 4.8 g/dL (ref 3.5–5.0)
Alkaline Phosphatase: 40 U/L (ref 38–126)
Anion gap: 13 (ref 5–15)
BILIRUBIN TOTAL: 1.1 mg/dL (ref 0.3–1.2)
BUN: 12 mg/dL (ref 6–20)
CO2: 23 mmol/L (ref 22–32)
Calcium: 10 mg/dL (ref 8.9–10.3)
Chloride: 105 mmol/L (ref 101–111)
Creatinine, Ser: 0.87 mg/dL (ref 0.44–1.00)
GFR calc non Af Amer: >60 mL/min (ref >60)
Glucose, Bld: 106 mg/dL — ABNORMAL HIGH (ref 65–99)
Potassium: 2.8 mmol/L — ABNORMAL LOW (ref 3.5–5.1)
Sodium: 141 mmol/L (ref 135–145)
TOTAL PROTEIN: 9.3 g/dL — AB (ref 6.5–8.1)

## 2017-06-25 LAB — I-STAT BETA HCG BLOOD, ED (MC, WL, AP ONLY)

## 2017-06-25 LAB — CBC WITH DIFFERENTIAL/PLATELET
Basophils Absolute: 0 10*3/uL (ref 0.0–0.1)
Basophils Relative: 0 %
EOS PCT: 0 %
Eosinophils Absolute: 0 10*3/uL (ref 0.0–0.7)
HCT: 35 % — ABNORMAL LOW (ref 36.0–46.0)
Hemoglobin: 13 g/dL (ref 12.0–15.0)
LYMPHS ABS: 1.3 10*3/uL (ref 0.7–4.0)
Lymphocytes Relative: 9 %
MCH: 28.3 pg (ref 26.0–34.0)
MCHC: 37 g/dL — AB (ref 30.0–36.0)
MCV: 76.3 fL — AB (ref 78.0–100.0)
Monocytes Absolute: 1.4 10*3/uL — ABNORMAL HIGH (ref 0.1–1.0)
Monocytes Relative: 10 %
NEUTROS ABS: 11.7 10*3/uL — AB (ref 1.7–7.7)
Neutrophils Relative %: 81 %
PLATELETS: 211 10*3/uL (ref 150–400)
RBC: 4.59 MIL/uL (ref 3.87–5.11)
RDW: 14.7 % (ref 11.5–15.5)
WBC: 14.4 10*3/uL — ABNORMAL HIGH (ref 4.0–10.5)

## 2017-06-25 LAB — MAGNESIUM: Magnesium: 1.9 mg/dL (ref 1.7–2.4)

## 2017-06-25 LAB — LIPASE, BLOOD: Lipase: 26 U/L (ref 11–51)

## 2017-06-25 MED ORDER — SODIUM CHLORIDE 0.9 % IV SOLN: INTRAVENOUS | Status: DC

## 2017-06-25 MED ORDER — ONDANSETRON HCL 4 MG/2ML IJ SOLN
4.0000 mg | Freq: Once | INTRAMUSCULAR | Status: AC
  Administered 2017-06-25: 4 mg via INTRAVENOUS
  Filled 2017-06-25: qty 2

## 2017-06-25 MED ORDER — POTASSIUM CHLORIDE 10 MEQ/100ML IV SOLN
10.0000 meq | Freq: Once | INTRAVENOUS | Status: AC
  Administered 2017-06-25: 10 meq via INTRAVENOUS
  Filled 2017-06-25: qty 100

## 2017-06-25 MED ORDER — ONDANSETRON HCL 4 MG PO TABS: 4.0000 mg | ORAL_TABLET | Freq: Three times a day (TID) | ORAL | 0 refills | Status: DC | PRN

## 2017-06-25 MED ORDER — DOXYLAMINE-PYRIDOXINE 10-10 MG PO TBEC: DELAYED_RELEASE_TABLET | ORAL | 0 refills | Status: DC

## 2017-06-25 MED ORDER — SODIUM CHLORIDE 0.9 % IV BOLUS (SEPSIS)
1000.0000 mL | Freq: Once | INTRAVENOUS | Status: AC
  Administered 2017-06-25: 1000 mL via INTRAVENOUS

## 2017-06-25 NOTE — ED Notes (Signed)
Bed: WA20 Expected date:  Expected time:  Means of arrival:  Comments: EMS 26 yo female abdominal pain/N/V-may be pregnant

## 2017-06-25 NOTE — ED Triage Notes (Signed)
Pt from home with c/o N/V and abd pain x3 days denies fever or diarrhea, pt reports possible pregnant

## 2017-06-25 NOTE — ED Provider Notes (Signed)
[facility]-EMERGENCY DEPT Provider Note   CSN: [id number] Arrival date & time: 06/25/17  0533     History   Chief Complaint Chief Complaint  Patient presents with  . Abdominal Pain  . Emesis    HPI Cynthia [name] is a 26 y.o. female who presents to the emergency department with a chief complaint of constant, worsening generalized abdominal pain with associated nausea, non-bilious, non-bloody emesis, x3 days. No aggravating or alleviating factors. No fever, diarrhea, dysuria, back pain, vaginal bleeding or discharge. No treatment prior to arrival. She reports that the last time that she experienced symptoms like this she found out she was pregnant. LMP was 04/26/17. No sick contacts.  The patient reports that she has barely eaten or drank anything over the last 3 days due to the vomiting. She states that she thinks she is dehydrated.  She reports a history of hyperemesis gravidarum during her previous pregnancy.   The history is provided by the patient. No language interpreter was used.    Past Medical History:  Diagnosis Date  . Narcolepsy   . No pertinent past medical history     Patient Active Problem List   Diagnosis Date Noted  . Active labor at term 11/07/2014  . NVD (normal vaginal delivery) 11/07/2014  . Hyperemesis gravidarum before end of [redacted] week gestation, dehydration 04/12/2014  . Hyperemesis gravidarum, antepartum 04/10/2014  . 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 DILATION AND CURETTAGE OF UTERUS      OB History    Gravida Para Term Preterm AB Living   SAB TAB Ectopic Multiple Live Births     3   0 1       Home Medications    Prior to Admission medications   Medication Sig Start Date End Date Taking? Authorizing Provider  amphetamine-dextroamphetamine (ADDERALL XR) 20 MG 24 hr capsule Take 1 capsule (20 mg total) by mouth daily. 04/27/17  Yes Young, Joni Fears D, MD  ondansetron  (ZOFRAN) 4 MG tablet Take 1 tablet (4 mg total) by mouth every 8 (eight) hours as needed for nausea or vomiting. 06/25/17   Evelena Masci A, PA-C    Family History Family History  Problem Relation Age of Onset  . Hypertension Other     Social History Social History  Substance Use Topics  . Smoking status: Never Smoker  . Smokeless tobacco: Never Used  . Alcohol use No     Comment: socially     Allergies   Patient has no known allergies.   Review of Systems Review of Systems  Constitutional: Negative for activity change, chills and fever.  Respiratory: Negative for shortness of breath.   Cardiovascular: Negative for chest pain.  Gastrointestinal: Positive for abdominal pain, nausea and vomiting. Negative for abdominal distention, constipation and diarrhea.  Genitourinary: Negative for dysuria, flank pain, urgency, vaginal bleeding, vaginal discharge and vaginal pain.  Musculoskeletal: Negative for back pain.  Skin: Negative for rash.     Physical Exam Updated Vital Signs BP (!) 136/56 (BP Location: Left Arm)   Pulse 76   Resp 16   SpO2 99%   Physical Exam  Constitutional: No distress.  HENT:  Head: Normocephalic.  Eyes: Conjunctivae are normal.  Neck: Neck supple.  Cardiovascular: Normal rate, regular rhythm and normal heart sounds.  Exam reveals no gallop and no friction rub.   No murmur heard. Pulmonary/Chest: Effort normal. No  respiratory distress. She has no wheezes. She has no rales.  Abdominal: Soft. She exhibits no distension. There is tenderness.  Moderately tender to palpation over the diffuse abdomen. No focal tenderness. Normoactive bowel sounds. No rebound or guarding. Negative Murphy sign.  Musculoskeletal: She exhibits no tenderness.  Neurological: She is alert.  Skin: Skin is warm. Capillary refill takes 2 to 3 seconds. No rash noted. She is not diaphoretic.  Psychiatric: Her behavior is normal.  Nursing note and vitals reviewed.  ED Treatments  / Results  Labs (all labs ordered are listed, but only abnormal results are displayed) Labs Reviewed  CBC WITH DIFFERENTIAL/PLATELET - Abnormal; Notable for the following:       Result Value   WBC 14.4 (*)    HCT 35.0 (*)    MCV 76.3 (*)    MCHC 37.0 (*)    Neutro Abs 11.7 (*)    Monocytes Absolute 1.4 (*)    All other components within normal limits  COMPREHENSIVE METABOLIC PANEL - Abnormal; Notable for the following:    Potassium 2.8 (*)    Glucose, Bld 106 (*)    Total Protein 9.3 (*)    All other components within normal limits  I-STAT BETA HCG BLOOD, ED ([facility], [facility], [facility] ONLY) - Abnormal; Notable for the following:    I-stat hCG, quantitative >2,000.0 (*)    All other components within normal limits  LIPASE, BLOOD  MAGNESIUM    EKG  EKG Interpretation None       Radiology No results found.  Procedures Procedures (including critical care time)  Medications Ordered in ED Medications  ondansetron (ZOFRAN) injection 4 mg (4 mg Intravenous Given 06/25/17 0627)  potassium chloride 10 mEq in 100 mL IVPB (0 mEq Intravenous Stopped 06/25/17 0829)  sodium chloride 0.9 % bolus 1,000 mL (0 mLs Intravenous Stopped 06/25/17 0934)     Initial Impression / Assessment and Plan / ED Course  I have reviewed the triage vital signs and the nursing notes.  Pertinent labs & imaging results that were available during my care of the patient were reviewed by me and considered in my medical decision making (see chart for details).     26 year old female with a history of hyperemesis gravidarum during her previous pregnancy presenting with generalized abdominal pain, nausea, vomiting 3 days. Pregnancy test is positive. K 2.8; IV potassium chloride given. WBC 14.4. The patient was given IV Zofran and IV fluids, which improved her symptoms. The patient was successfully fluid challenge. Plan to discharge the patient with Diclegis, but the patient spoke with her pharmacy prior to discharge and  stated that prior authorization will be required for the medication. Discontinue the medicine and discharge the patient with Zofran. Referral given to OB/GYN. Strict return precautions given, including if the patient develops vaginal bleeding or severe abdominal pain, or continued nausea and vomiting despite taking Zofran. Vital signs stable. No acute distress. The patient is safe for discharge at this time.  Final Clinical Impressions(s) / ED Diagnoses   Final diagnoses:  Generalized abdominal pain  Less than [redacted] weeks gestation of pregnancy    New Prescriptions Discharge Medication List as of 06/25/2017 10:31 AM    START taking these medications   Details  ondansetron (ZOFRAN) 4 MG tablet Take 1 tablet (4 mg total) by mouth every 8 (eight) hours as needed for nausea or vomiting., Starting Fri 06/25/2017, Print         Jailen Lung A, PA-C 06/25/17 1725  Lorre Nick, MD 06/28/17 [phone]

## 2017-06-25 NOTE — Discharge Instructions (Addendum)
Please start taking a daily prenatal vitamin. You may take 1 tablet of Zofran every 8 hours as needed for nausea and vomiting. Please do not take more than one tablet every 8 hours.  [facility] and get established with an [facility]/GYN.   If you develop new or worsening symptoms, including nausea or vomiting despite taking the medication that has been prescribed, fever, or severe abdominal pain, please return to the emergency department for reevaluation.

## 2017-09-09 ENCOUNTER — Telehealth: Payer: Self-pay | Admitting: Internal Medicine

## 2017-09-09 NOTE — Telephone Encounter (Signed)
lmtcb x1 pt 

## 2017-09-10 NOTE — Telephone Encounter (Signed)
ATC pt, no answer. Left message for pt to call back.  

## 2017-09-13 ENCOUNTER — Telehealth: Payer: Self-pay | Admitting: Internal Medicine

## 2017-09-13 NOTE — Telephone Encounter (Signed)
Spoke with the pt  She states needing refill on adderall  I advised will need to get the okay from [facility] for this  She no showed for her last ov, and hung up the phone before I could offer to schedule her another appt  Please advise on rx, thanks Last filled on 04/27/17 # 30

## 2017-09-13 NOTE — Telephone Encounter (Signed)
Dr. Maple HudsonYoung, please advise if we can refill this medication for pt.  No Known Allergies   Current Outpatient Medications:  .  amphetamine-dextroamphetamine (ADDERALL XR) 20 MG 24 hr capsule, Take 1 capsule (20 mg total) by mouth daily., Disp: 30 capsule, Rfl: 0 .  ondansetron (ZOFRAN) 4 MG tablet, Take 1 tablet (4 mg total) by mouth every 8 (eight) hours as needed for nausea or vomiting., Disp: 15 tablet, Rfl: 0

## 2017-09-15 MED ORDER — AMPHETAMINE-DEXTROAMPHET ER 20 MG PO CP24: 20.0000 mg | ORAL_CAPSULE | Freq: Every day | ORAL | 0 refills | Status: DC

## 2017-09-15 NOTE — Telephone Encounter (Signed)
Ok x one then def ov for any more

## 2017-09-15 NOTE — Telephone Encounter (Signed)
Pt is calling back requesting refill of Adderall. Pharm [facility] [address] [phone].

## 2017-09-15 NOTE — Telephone Encounter (Signed)
Ok refill adderall Not Zofran. I am not treating any condition that requires Zofran. She would need to get that from another prescriber

## 2017-09-15 NOTE — Telephone Encounter (Signed)
Tried to call pt letting her know we needed her to come in for an OV since she no showed for her last OV but pt did not answer and voicemail box is full.  Will try to call pt back later.

## 2017-09-15 NOTE — Telephone Encounter (Signed)
Pt returning call regarding RX for Adderall. [facility] [address] [facility] [phone].

## 2017-09-15 NOTE — Telephone Encounter (Signed)
ATC pt, no answer. Mailbox is full.   Rx printed.

## 2017-09-15 NOTE — Telephone Encounter (Signed)
Called pt letting her know that we needed to schedule her for an OV since she was supposed to see CY around 05/2017.  Pt no showed for that visit with CY. Pt called 12/24 and conversation was taken by Verlon AuLeslie who was then hung up on while she was telling pt the same information.  Pt told me in frustration that it was going to be a long time before she would be able to come in for an appt due to working 8 hours a day and due to her sleeping disorder needs to have the Rx of Adderall sent in to her pharmacy.  Rx was last refilled 04/27/17 with #30, 0-RF.  Dr. Sherene SiresWert, please advise if you are okay refilling this med for pt or if you want us to wait until Dr. Maple HudsonYoung comes back from vacation to see if he will okay refilling this med for pt.  Verlon AuLeslie did route the conversation she had on 12/24 to CY, but pt is still calling in and wanting an answer.  Thanks!

## 2017-09-16 NOTE — Telephone Encounter (Signed)
Patient returned call, CB is [phone]. There is another message open about this as well stating RX has been printed.  I checked folder up front and no RX has been left up front for patient.

## 2017-09-16 NOTE — Telephone Encounter (Signed)
Per Delaney Meigsamara Rx was placed in outgoing mail signed by MW. (please see 09/09/17 phone note) Pt is aware and voiced her understanding. Nothing further is needed.

## 2017-09-16 NOTE — Telephone Encounter (Signed)
atc pt- unable to leave vm due to mailbox being full.  Will call back.  

## 2017-09-17 NOTE — Telephone Encounter (Signed)
atc na 12/28

## 2017-09-20 ENCOUNTER — Encounter (HOSPITAL_COMMUNITY): Payer: Self-pay | Admitting: *Deleted

## 2017-09-20 ENCOUNTER — Inpatient Hospital Stay (HOSPITAL_COMMUNITY)
Admission: AD | Admit: 2017-09-20 | Discharge: 2017-09-20 | Disposition: A | Discharge status: Home / Self Care | Payer: Medicaid Other | Source: Ambulatory Visit | Attending: Obstetrics & Gynecology | Admitting: Obstetrics & Gynecology

## 2017-09-20 DIAGNOSIS — R12 Heartburn: Secondary | ICD-10-CM | POA: Insufficient documentation

## 2017-09-20 DIAGNOSIS — O26891 Other specified pregnancy related conditions, first trimester: Secondary | ICD-10-CM | POA: Diagnosis not present

## 2017-09-20 DIAGNOSIS — Z3A08 8 weeks gestation of pregnancy: Secondary | ICD-10-CM | POA: Insufficient documentation

## 2017-09-20 DIAGNOSIS — O218 Other vomiting complicating pregnancy: Secondary | ICD-10-CM | POA: Diagnosis not present

## 2017-09-20 DIAGNOSIS — O219 Vomiting of pregnancy, unspecified: Secondary | ICD-10-CM | POA: Diagnosis not present

## 2017-09-20 LAB — URINALYSIS, ROUTINE W REFLEX MICROSCOPIC
GLUCOSE, UA: NEGATIVE mg/dL
HGB URINE DIPSTICK: NEGATIVE
Ketones, ur: NEGATIVE mg/dL
LEUKOCYTES UA: NEGATIVE
NITRITE: NEGATIVE
Protein, ur: 30 mg/dL — AB
SPECIFIC GRAVITY, URINE: 1.027 (ref 1.005–1.030)
pH: 5 (ref 5.0–8.0)

## 2017-09-20 LAB — BASIC METABOLIC PANEL
ANION GAP: 10 (ref 5–15)
BUN: 8 mg/dL (ref 6–20)
CO2: 22 mmol/L (ref 22–32)
Calcium: 9.6 mg/dL (ref 8.9–10.3)
Chloride: 105 mmol/L (ref 101–111)
Creatinine, Ser: 0.84 mg/dL (ref 0.44–1.00)
GLUCOSE: 134 mg/dL — AB (ref 65–99)
POTASSIUM: 3.2 mmol/L — AB (ref 3.5–5.1)
Sodium: 137 mmol/L (ref 135–145)

## 2017-09-20 LAB — POCT PREGNANCY, URINE: PREG TEST UR: POSITIVE — AB

## 2017-09-20 MED ORDER — PROMETHAZINE HCL 25 MG/ML IJ SOLN
25.0000 mg | Freq: Once | INTRAMUSCULAR | Status: AC
  Administered 2017-09-20: 25 mg via INTRAVENOUS
  Filled 2017-09-20: qty 1

## 2017-09-20 MED ORDER — PANTOPRAZOLE SODIUM 40 MG PO TBEC: 40.0000 mg | DELAYED_RELEASE_TABLET | Freq: Every day | ORAL | 0 refills | Status: DC

## 2017-09-20 MED ORDER — SODIUM CHLORIDE 0.9 % IV SOLN
8.0000 mg | Freq: Once | INTRAVENOUS | Status: AC
  Administered 2017-09-20: 8 mg via INTRAVENOUS
  Filled 2017-09-20: qty 4

## 2017-09-20 MED ORDER — LACTATED RINGERS IV BOLUS (SEPSIS)
1000.0000 mL | Freq: Once | INTRAVENOUS | Status: AC
  Administered 2017-09-20: 1000 mL via INTRAVENOUS

## 2017-09-20 MED ORDER — FAMOTIDINE IN NACL 20-0.9 MG/50ML-% IV SOLN
20.0000 mg | Freq: Once | INTRAVENOUS | Status: AC
  Administered 2017-09-20: 20 mg via INTRAVENOUS
  Filled 2017-09-20: qty 50

## 2017-09-20 MED ORDER — PROMETHAZINE HCL 25 MG PO TABS: 25.0000 mg | ORAL_TABLET | Freq: Four times a day (QID) | ORAL | 0 refills | Status: DC | PRN

## 2017-09-20 MED ORDER — PANTOPRAZOLE SODIUM 40 MG IV SOLR
40.0000 mg | Freq: Once | INTRAVENOUS | Status: AC
  Administered 2017-09-20: 40 mg via INTRAVENOUS
  Filled 2017-09-20: qty 40

## 2017-09-20 MED ORDER — GLYCOPYRROLATE 1 MG PO TABS: 1.0000 mg | ORAL_TABLET | Freq: Three times a day (TID) | ORAL | 0 refills | Status: DC | PRN

## 2017-09-20 MED ORDER — SCOPOLAMINE 1 MG/3DAYS TD PT72: 1.0000 | MEDICATED_PATCH | TRANSDERMAL | Status: DC

## 2017-09-20 MED ORDER — GLYCOPYRROLATE 0.2 MG/ML IJ SOLN
0.2000 mg | Freq: Once | INTRAMUSCULAR | Status: AC
  Administered 2017-09-20: 0.2 mg via INTRAVENOUS
  Filled 2017-09-20: qty 1

## 2017-09-20 NOTE — MAU Note (Addendum)
Patient vomiting during triage and history assessment.  LMP unsure of dates ?November

## 2017-09-20 NOTE — MAU Note (Addendum)
Pt had positive HPT. Pt c/o n/v for  About 1 week has gotten worse  For the past few days. C/o burning in her chest.pt took some zofran she had . Had been working but not working anymore for the nausea.. Denies any vaginal bleeding or discharge.

## 2017-09-20 NOTE — Discharge Instructions (Signed)
Morning Sickness Morning sickness is when you feel sick to your stomach (nauseous) during pregnancy. This nauseous feeling may or may not come with vomiting. It often occurs in the morning but can be a problem any time of day. Morning sickness is most common during the first trimester, but it may continue throughout pregnancy. While morning sickness is unpleasant, it is usually harmless unless you develop severe and continual vomiting (hyperemesis gravidarum). This condition requires more intense treatment. What are the causes? The cause of morning sickness is not completely known but seems to be related to normal hormonal changes that occur in pregnancy. What increases the risk? You are at greater risk if you:  Experienced nausea or vomiting before your pregnancy.  Had morning sickness during a previous pregnancy.  Are pregnant with more than one baby, such as twins.  How is this treated? Do not use any medicines (prescription, over-the-counter, or herbal) for morning sickness without first talking to your health care provider. Your health care provider may prescribe or recommend:  Vitamin B6 supplements.  Anti-nausea medicines.  The herbal medicine ginger.  Follow these instructions at home:  Only take over-the-counter or prescription medicines as directed by your health care provider.  Taking multivitamins before getting pregnant can prevent or decrease the severity of morning sickness in most women.  Eat a piece of dry toast or unsalted crackers before getting out of bed in the morning.  Eat five or six small meals a day.  Eat dry and bland foods (rice, baked potato). Foods high in carbohydrates are often helpful.  Do not drink liquids with your meals. Drink liquids between meals.  Avoid greasy, fatty, and spicy foods.  Get someone to cook for you if the smell of any food causes nausea and vomiting.  If you feel nauseous after taking prenatal vitamins, take the vitamins at  night or with a snack.  Snack on protein foods (nuts, yogurt, cheese) between meals if you are hungry.  Eat unsweetened gelatins for desserts.  Wearing an acupressure wristband (worn for sea sickness) may be helpful.  Acupuncture may be helpful.  Do not smoke.  Get a humidifier to keep the air in your house free of odors.  Get plenty of fresh air. Contact a health care provider if:  Your home remedies are not working, and you need medicine.  You feel dizzy or lightheaded.  You are losing weight. Get help right away if:  You have persistent and uncontrolled nausea and vomiting.  You pass out (faint). This information is not intended to replace advice given to you by your health care provider. Make sure you discuss any questions you have with your health care provider. Document Released: 10/29/2006 Document Revised: 02/13/2016 Document Reviewed: 02/22/2013 Elsevier Interactive Patient Education  2017 [employer] Heartburn During Pregnancy Heartburn is a type of pain or discomfort that can happen in the throat or chest. It is often described as a burning sensation. Heartburn is common during pregnancy because:  A hormone (progesterone) that is released during pregnancy may relax the valve (lower esophageal sphincter, or LES) that separates the esophagus from the stomach. This allows stomach acid to move up into the esophagus, causing heartburn.  The uterus gets larger and pushes up on the stomach, which pushes more acid into the esophagus. This is especially true in the later stages of pregnancy.  Heartburn usually goes away or gets better after giving birth. What are the causes? Heartburn is caused by stomach acid backing up into  the esophagus (reflux). Reflux can be triggered by:  Changing hormone levels.  Large meals.  Certain foods and beverages, such as coffee, chocolate, onions, and peppermint.  Exercise.  Increased stomach acid production.  What increases the  risk? You are more likely to experience heartburn during pregnancy if you:  Had heartburn prior to becoming pregnant.  Have been pregnant more than once before.  Are overweight or obese.  The likelihood that you will get heartburn also increases as you get farther along in your pregnancy, especially during the last trimester. What are the signs or symptoms? Symptoms of this condition include:  Burning pain in the chest or lower throat.  Bitter taste in the mouth.  Coughing.  Problems swallowing.  Vomiting.  Hoarse voice.  Asthma.  Symptoms may get worse when you lie down or bend over. Symptoms are often worse at night. How is this diagnosed? This condition is diagnosed based on:  Your medical history.  Your symptoms.  Blood tests to check for a certain type of bacteria associated with heartburn.  Whether taking heartburn medicine relieves your symptoms.  Examination of the stomach and esophagus using a tube with a light and camera on the end (endoscopy).  How is this treated? Treatment varies depending on how severe your symptoms are. Your health care provider may recommend:  Over-the-counter medicines (antacids or acid reducers) for mild heartburn.  Prescription medicines to decrease stomach acid or to protect your stomach lining.  Certain changes in your diet.  Raising the head of your bed so it is higher than the foot of the bed. This helps prevent stomach acid from backing up into the esophagus when you are lying down.  Follow these instructions at home: Eating and drinking  Do not drink alcohol during your pregnancy.  Identify foods and beverages that make your symptoms worse, and avoid them.  Beverages that you may want to avoid include: ? Coffee and tea (with or without caffeine). ? Energy drinks and sports drinks. ? Carbonated drinks or sodas. ? Citrus fruit juices.  Foods that you may want to avoid include: ? Chocolate and  cocoa. ? Peppermint and mint flavorings. ? Garlic, onions, and horseradish. ? Spicy and acidic foods, including peppers, chili powder, curry powder, vinegar, hot sauces, and barbecue sauce. ? Citrus fruits, such as oranges, lemons, and limes. ? Tomato-based foods, such as red sauce, chili, and salsa. ? Fried and fatty foods, such as donuts, french fries, potato chips, and high-fat dressings. ? High-fat meats, such as hot dogs, cold cuts, sausage, ham, and bacon. ? High-fat dairy items, such as whole milk, butter, and cheese.  Eat small, frequent meals instead of large meals.  Avoid drinking large amounts of liquid with your meals.  Avoid eating meals during the 2-3 hours before bedtime.  Avoid lying down right after you eat.  Do not exercise right after you eat. Medicines  Take over-the-counter and prescription medicines only as told by your health care provider.  Do not take aspirin, ibuprofen, or other NSAIDs unless your health care provider tells you to do that.  You may be instructed to avoid medicines that contain sodium bicarbonate. General instructions  If directed, raise the head of your bed about 6 inches (15 cm) by putting blocks under the legs. Sleeping with more pillows does not effectively relieve heartburn because it only changes the position of your head.  Do not use any products that contain nicotine or tobacco, such as cigarettes and e-cigarettes. If you  need help quitting, ask your health care provider.  Wear loose-fitting clothing.  Try to reduce your stress, such as with yoga or meditation. If you need help managing stress, ask your health care provider.  Maintain a healthy weight. If you are overweight, work with your health care provider to safely lose weight.  Keep all follow-up visits as told by your health care provider. This is important. Contact a health care provider if:  You develop new symptoms.  Your symptoms do not improve with  treatment.  You have unexplained weight loss.  You have difficulty swallowing.  You make loud sounds when you breathe (wheeze).  You have a cough that does not go away.  You have frequent heartburn for more than 2 weeks.  You have nausea or vomiting that does not get better with treatment.  You have pain in your abdomen. Get help right away if:  You have severe chest pain that spreads to your arm, neck, or jaw.  You feel sweaty, dizzy, or light-headed.  You have shortness of breath.  You have pain when swallowing.  You vomit, and your vomit looks like blood or coffee grounds.  Your stool is bloody or black. This information is not intended to replace advice given to you by your health care provider. Make sure you discuss any questions you have with your health care provider. Document Released: 09/04/2000 Document Revised: 05/25/2016 Document Reviewed: 05/25/2016 Elsevier Interactive Patient Education  2018 [employer]

## 2017-09-20 NOTE — MAU Provider Note (Signed)
History     CSN: [id number]  Arrival date and time: 09/20/17 1006   First Provider Initiated Contact with Patient 09/20/17 1056      Chief Complaint  Patient presents with  . Emesis   HPI  Cynthia [name] is a 26 y.o. [id number] at [id number] who presents with n/v. Symptoms began last week. Reports vomiting countless times today. Also endorses heartburn. Took a zofran that she had left over this morning without relief. Increased saliva production & spitting. Denies abdominal pain, fever/chills, vaginal bleeding, or diarrhea. Has not started prenatal care.   OB History    Gravida Para Term Preterm AB Living   6 2 2   3 1    SAB TAB Ectopic Multiple Live Births     3   0 1      Past Medical History:  Diagnosis Date  . Narcolepsy     Past Surgical History:  Procedure Laterality Date  . DILATION AND CURETTAGE OF UTERUS      Family History  Problem Relation Age of Onset  . Hypertension Other     Social History   Tobacco Use  . Smoking status: Never Smoker  . Smokeless tobacco: Never Used  Substance Use Topics  . Alcohol use: No    Comment: socially  . Drug use: No    Allergies: No Known Allergies  Medications Prior to Admission  Medication Sig Dispense Refill Last Dose  . amphetamine-dextroamphetamine (ADDERALL XR) 20 MG 24 hr capsule Take 1 capsule (20 mg total) by mouth daily. 30 capsule 0   . ondansetron (ZOFRAN) 4 MG tablet Take 1 tablet (4 mg total) by mouth every 8 (eight) hours as needed for nausea or vomiting. 15 tablet 0     Review of Systems  Constitutional: Negative.   Gastrointestinal: Positive for nausea and vomiting. Negative for abdominal pain, constipation and diarrhea.  Genitourinary: Negative.    Physical Exam   Blood pressure 114/61, pulse 76, temperature 98.2 F (36.8 C), temperature source Oral, resp. rate 16, height 4\' 11"  (1.499 m), weight 130 lb (59 kg), last menstrual period 07/22/2017, SpO2 100 %, unknown if currently  breastfeeding.  Physical Exam  Nursing note and vitals reviewed. Constitutional: She is oriented to person, place, and time. She appears well-developed and well-nourished. No distress.  HENT:  Head: Normocephalic and atraumatic.  Eyes: Conjunctivae are normal. Right eye exhibits no discharge. Left eye exhibits no discharge. No scleral icterus.  Neck: Normal range of motion.  Cardiovascular: Normal rate, regular rhythm and normal heart sounds.  No murmur heard. Respiratory: Effort normal and breath sounds normal. No respiratory distress. She has no wheezes.  GI: Soft. There is no tenderness.  Neurological: She is alert and oriented to person, place, and time.  Skin: Skin is warm and dry. She is not diaphoretic.  Psychiatric: She has a normal mood and affect. Her behavior is normal. Judgment and thought content normal.    MAU Course  Procedures Results for orders placed or performed during the hospital encounter of 09/20/17 (from the past 24 hour(s))  Urinalysis, Routine w reflex microscopic     Status: Abnormal   Collection Time: 09/20/17 10:30 AM  Result Value Ref Range   Color, Urine YELLOW YELLOW   APPearance HAZY (A) CLEAR   Specific Gravity, Urine 1.027 1.005 - 1.030   pH 5.0 5.0 - 8.0   Glucose, UA NEGATIVE NEGATIVE mg/dL   Hgb urine dipstick NEGATIVE NEGATIVE   Bilirubin Urine SMALL (A) NEGATIVE  Ketones, ur NEGATIVE NEGATIVE mg/dL   Protein, ur 30 (A) NEGATIVE mg/dL   Nitrite NEGATIVE NEGATIVE   Leukocytes, UA NEGATIVE NEGATIVE   RBC / HPF 0-5 0 - 5 RBC/hpf   WBC, UA 0-5 0 - 5 WBC/hpf   Bacteria, UA RARE (A) NONE SEEN   Squamous Epithelial / LPF 0-5 (A) NONE SEEN   Mucus PRESENT    Hyaline Casts, UA PRESENT   Pregnancy, urine POC     Status: Abnormal   Collection Time: 09/20/17 10:48 AM  Result Value Ref Range   Preg Test, Ur POSITIVE (A) NEGATIVE  Basic metabolic panel     Status: Abnormal   Collection Time: 09/20/17 11:05 AM  Result Value Ref Range   Sodium  137 135 - 145 mmol/L   Potassium 3.2 (L) 3.5 - 5.1 mmol/L   Chloride 105 101 - 111 mmol/L   CO2 22 22 - 32 mmol/L   Glucose, Bld 134 (H) 65 - 99 mg/dL   BUN 8 6 - 20 mg/dL   Creatinine, Ser 1.610.84 0.44 - 1.00 mg/dL   Calcium 9.6 8.9 - 09.610.3 mg/dL   GFR calc non Af Amer >60 >60 mL/min   GFR calc Af Amer >60 >60 mL/min   Anion gap 10 5 - 15    MDM VSS IV fluids -- LR bolus x 2 Phenergan 25 mg, pepcid 20 mg Pt continues to vomit Zofran 8 mg, protonix 40 mg IV --- pt reports improvement in n/v & heartburn but requesting medication for spitting. Robinul 0.2 mg IV given  Assessment and Plan  A: 1. Nausea and vomiting during pregnancy prior to [redacted] weeks gestation   2. Heartburn during pregnancy in first trimester    P: Discharge home Rx phenergan, protonix, & robinul Discussed reasons to return to MAU Start prenatal care  Judeth Hornrin Marijo Quizon 09/20/2017, 10:56 AM

## 2017-09-20 NOTE — Telephone Encounter (Signed)
Please see 09/09/17 phone note.  Nothing further is needed.

## 2017-10-16 ENCOUNTER — Other Ambulatory Visit: Payer: Self-pay | Admitting: Student

## 2017-11-22 ENCOUNTER — Ambulatory Visit: Payer: Medicaid Other | Admitting: Internal Medicine

## 2017-12-06 ENCOUNTER — Ambulatory Visit: Payer: Medicaid Other | Admitting: Internal Medicine

## 2017-12-14 ENCOUNTER — Ambulatory Visit: Payer: Medicaid Other | Admitting: Adult Health

## 2018-01-11 ENCOUNTER — Ambulatory Visit: Payer: Medicaid Other | Admitting: Adult Health

## 2018-01-18 ENCOUNTER — Ambulatory Visit: Payer: Medicaid Other | Admitting: Internal Medicine

## 2018-01-26 ENCOUNTER — Ambulatory Visit: Payer: Medicaid Other | Admitting: Adult Health

## 2018-01-27 ENCOUNTER — Telehealth: Payer: Self-pay | Admitting: Internal Medicine

## 2018-01-27 NOTE — Telephone Encounter (Signed)
Spoke with the pt and notified of recs per CDY  She states "I really don't care, don't call my phone no more"

## 2018-01-27 NOTE — Telephone Encounter (Signed)
Sorry. No refill at this office until she is seen again. This is a controlled drug and we must go by the book.

## 2018-01-27 NOTE — Telephone Encounter (Signed)
Spoke with patient-she is aware that she has not been seen since 11-2016 and cancelled 01-2018 appt with TP. Pt understands that she MUST be seen at least yearly to keep current with protocol of office and Insurance. CY please advise on refill of adderral this time.

## 2018-02-24 ENCOUNTER — Ambulatory Visit: Payer: Medicaid Other | Admitting: Adult Health

## 2018-02-24 ENCOUNTER — Encounter: Payer: Self-pay | Admitting: Adult Health

## 2018-02-24 ENCOUNTER — Telehealth: Payer: Self-pay | Admitting: Adult Health

## 2018-02-24 ENCOUNTER — Other Ambulatory Visit (INDEPENDENT_AMBULATORY_CARE_PROVIDER_SITE_OTHER): Payer: Medicaid Other

## 2018-02-24 VITALS — BP 128/68 | HR 71 | Ht 59.0 in | Wt 136.0 lb

## 2018-02-24 DIAGNOSIS — G47419 Narcolepsy without cataplexy: Secondary | ICD-10-CM | POA: Diagnosis not present

## 2018-02-24 DIAGNOSIS — Z5181 Encounter for therapeutic drug level monitoring: Secondary | ICD-10-CM | POA: Diagnosis not present

## 2018-02-24 DIAGNOSIS — O210 Mild hyperemesis gravidarum: Secondary | ICD-10-CM

## 2018-02-24 DIAGNOSIS — Z5189 Encounter for other specified aftercare: Secondary | ICD-10-CM

## 2018-02-24 LAB — HCG, QUANTITATIVE, PREGNANCY: QUANTITATIVE HCG: 392.48 m[IU]/mL

## 2018-02-24 MED ORDER — AMPHETAMINE-DEXTROAMPHET ER 20 MG PO CP24: 20.0000 mg | ORAL_CAPSULE | Freq: Every day | ORAL | 0 refills | Status: DC

## 2018-02-24 NOTE — Assessment & Plan Note (Signed)
Narcolepsy with daytime hypersomnia. Discussion with patient regarding education on narcolepsy and medication use.  Patient is encouraged on medication compliance and regular follow-up visits.  Patient will need a pregnancy test today prior to prescription being given Discussion regarding pregnancy category risk.  Plan  Patient Instructions  Script printed for Adderal 20 mg XR    1 each morning as needed- once lab test is back   Call us a couple of days ahead when you need refills, so you have time to pick it up.  Nap when you can.  Make sure to get enough sleep at night  Follow up with Dr. Maple HudsonYoung  In 2-3 months and As needed

## 2018-02-24 NOTE — Telephone Encounter (Signed)
Patient seen by TP 02/24/18 for refill on Adderall 20mg  qd Pregnancy test was performed as patient was seen in the MAU December 2018 w/ positive pregnancy test  Testing today was positive Per TP: we are unable to provide this medication as this time d/t positive pregnancy test.  She needs to follow up with her OBGYN as soon as possible.    Called spoke with patient to discuss the above Pt voiced her understanding  Adderall Rx was voided and discarded with a witness  Nothing further needed; will sign off

## 2018-02-24 NOTE — Progress Notes (Signed)
 @Patient  ID: Cynthia [name], female    DOB: October 03, 1990, 27 y.o.   MRN: [id number]  Chief Complaint  Patient presents with  . Follow-up    Narcolepsy     Referring provider: No ref. provider found  HPI: 27 yo female never smoker followed for Nacrolepsy with hypersomnia   TEST  NPSG- 10/09/09- AHI 0.2 per hour, RDI 20.4 per hour intermittent moderate snoring- MSLT 07/06/11- mean latency 2.1 minutes with SOREM  5   02/24/2018 Follow up : Narcolepsy w/ Hypersomnia  Patient presents for a one-year follow-up.  She is followed for narcolepsy with hypersomnia.  She has previously been maintained on Adderall XR 20 mill grams daily.  She says she has not taken in long time but does not feel it works for her . She falls asleep throughout the day . It was hard to determine how consistent she takes as she was on her cell phone the entire office visit. She was very angry that she is not better. I tried to explain to the patient that we will need to see her on a consistent basis and monitor the effectiveness of medications in order to evaluate  No evidence of seizure activity or cataplexy    No Known Allergies  Immunization History  Administered Date(s) Administered  . Influenza Split 06/21/2014    Past Medical History:  Diagnosis Date  . Narcolepsy     Tobacco History: Social History   Tobacco Use  Smoking Status Never Smoker  Smokeless Tobacco Never Used   Counseling given: Not Answered   Outpatient Encounter Medications as of 02/24/2018  Medication Sig  . [DISCONTINUED] glycopyrrolate (ROBINUL) 1 MG tablet Take 1 tablet (1 mg total) by mouth 3 (three) times daily as needed. (Patient not taking: Reported on 02/24/2018)  . [DISCONTINUED] ondansetron (ZOFRAN) 4 MG tablet Take 1 tablet (4 mg total) by mouth every 8 (eight) hours as needed for nausea or vomiting. (Patient not taking: Reported on 02/24/2018)  . [DISCONTINUED] pantoprazole (PROTONIX) 40 MG tablet TAKE 1 TABLET BY MOUTH  EVERY DAY (Patient not taking: Reported on 02/24/2018)  . [DISCONTINUED] promethazine (PHENERGAN) 25 MG tablet Take 1 tablet (25 mg total) by mouth every 6 (six) hours as needed for nausea or vomiting. (Patient not taking: Reported on 02/24/2018)   No facility-administered encounter medications on file as of 02/24/2018.      Review of Systems  Constitutional:   No  weight loss, night sweats,  Fevers, chills,  +fatigue, or  lassitude.  HEENT:   No headaches,  Difficulty swallowing,  Tooth/dental problems, or  Sore throat,                No sneezing, itching, ear ache, nasal congestion, post nasal drip,   CV:  No chest pain,  Orthopnea, PND, swelling in lower extremities, anasarca, dizziness, palpitations, syncope.   GI  No heartburn, indigestion, abdominal pain, nausea, vomiting, diarrhea, change in bowel habits, loss of appetite, bloody stools.   Resp: No shortness of breath with exertion or at rest.  No excess mucus, no productive cough,  No non-productive cough,  No coughing up of blood.  No change in color of mucus.  No wheezing.  No chest wall deformity  Skin: no rash or lesions.  GU: no dysuria, change in color of urine, no urgency or frequency.  No flank pain, no hematuria   MS:  No joint pain or swelling.  No decreased range of motion.  No back pain.  Physical Exam  BP 128/68 (BP Location: Left Arm, Cuff Size: Normal)   Pulse 71   Ht 4\' 11"  (1.499 m)   Wt 136 lb (61.7 kg)   LMP 07/22/2017 (Within Weeks)   SpO2 100%   BMI 27.47 kg/m   GEN: A/Ox3; pleasant , NAD, well nourished    HEENT:  [place]/AT,  , NOSE-clear, THROAT-clear, no lesions, no postnasal drip or exudate noted.   NECK:  Supple w/ fair ROM; no JVD; normal carotid impulses w/o bruits; no thyromegaly or nodules palpated; no lymphadenopathy.    RESP  Clear  P & A; w/o, wheezes/ rales/ or rhonchi. no accessory muscle use, no dullness to percussion  CARD:  RRR, no m/r/g, no peripheral edema, pulses intact, no  cyanosis or clubbing.  GI:   Soft & nt; nml bowel sounds; no organomegaly or masses detected.   Musco: Warm bil, no deformities or joint swelling noted.   Neuro: alert, no focal deficits noted.    Skin: Warm, no lesions or rashes    Lab Results:  CBC  BMET  BNP No results found for: BNP  ProBNP No results found for: PROBNP  Imaging: No results found.   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 Discussion with patient regarding education on narcolepsy and medication use.  Patient is encouraged on medication compliance and regular follow-up visits.  Patient will need a pregnancy test today prior to prescription being given Discussion regarding pregnancy category risk.  Plan  Patient Instructions  Script printed for Adderal 20 mg XR    1 each morning as needed- once lab test is back   Call us a couple of days ahead when you need refills, so you have time to pick it up.  Nap when you can.  Make sure to get enough sleep at night  Follow up with Dr. Maple Hudson  In 2-3 months and As needed           Rubye Oaks, NP 02/24/2018

## 2018-02-24 NOTE — Patient Instructions (Signed)
Script printed for Adderal 20 mg XR    1 each morning as needed- once lab test is back   Call us a couple of days ahead when you need refills, so you have time to pick it up.  Nap when you can.  Make sure to get enough sleep at night  Follow up with Dr. Maple HudsonYoung  In 2-3 months and As needed

## 2018-02-24 NOTE — Addendum Note (Signed)
Addended by: Boone MasterJONES, JESSICA E on: 02/24/2018 11:57 AM   Modules accepted: Orders

## 2018-02-28 NOTE — Progress Notes (Signed)
Referral to OB has been placed.

## 2018-03-15 ENCOUNTER — Encounter (HOSPITAL_COMMUNITY): Payer: Self-pay

## 2018-03-15 ENCOUNTER — Inpatient Hospital Stay (HOSPITAL_COMMUNITY)
Admission: AD | Admit: 2018-03-15 | Discharge: 2018-03-16 | Disposition: A | Discharge status: Home / Self Care | Payer: Medicaid Other | Source: Ambulatory Visit | Attending: Family Medicine | Admitting: Family Medicine

## 2018-03-15 DIAGNOSIS — G47419 Narcolepsy without cataplexy: Secondary | ICD-10-CM | POA: Insufficient documentation

## 2018-03-15 DIAGNOSIS — O3680X0 Pregnancy with inconclusive fetal viability, not applicable or unspecified: Secondary | ICD-10-CM

## 2018-03-15 DIAGNOSIS — O161 Unspecified maternal hypertension, first trimester: Secondary | ICD-10-CM | POA: Insufficient documentation

## 2018-03-15 DIAGNOSIS — E876 Hypokalemia: Secondary | ICD-10-CM | POA: Diagnosis not present

## 2018-03-15 DIAGNOSIS — Z3A01 Less than 8 weeks gestation of pregnancy: Secondary | ICD-10-CM | POA: Insufficient documentation

## 2018-03-15 DIAGNOSIS — O99281 Endocrine, nutritional and metabolic diseases complicating pregnancy, first trimester: Secondary | ICD-10-CM | POA: Diagnosis not present

## 2018-03-15 DIAGNOSIS — O99351 Diseases of the nervous system complicating pregnancy, first trimester: Secondary | ICD-10-CM | POA: Diagnosis not present

## 2018-03-15 DIAGNOSIS — R112 Nausea with vomiting, unspecified: Secondary | ICD-10-CM | POA: Diagnosis present

## 2018-03-15 DIAGNOSIS — O219 Vomiting of pregnancy, unspecified: Secondary | ICD-10-CM | POA: Diagnosis not present

## 2018-03-15 DIAGNOSIS — R102 Pelvic and perineal pain: Secondary | ICD-10-CM

## 2018-03-15 DIAGNOSIS — O99611 Diseases of the digestive system complicating pregnancy, first trimester: Secondary | ICD-10-CM | POA: Insufficient documentation

## 2018-03-15 DIAGNOSIS — K117 Disturbances of salivary secretion: Secondary | ICD-10-CM | POA: Diagnosis present

## 2018-03-15 DIAGNOSIS — O26899 Other specified pregnancy related conditions, unspecified trimester: Secondary | ICD-10-CM

## 2018-03-15 DIAGNOSIS — I10 Essential (primary) hypertension: Secondary | ICD-10-CM

## 2018-03-15 DIAGNOSIS — E860 Dehydration: Secondary | ICD-10-CM | POA: Diagnosis not present

## 2018-03-15 LAB — URINALYSIS, ROUTINE W REFLEX MICROSCOPIC
BILIRUBIN URINE: NEGATIVE
Glucose, UA: NEGATIVE mg/dL
Hgb urine dipstick: NEGATIVE
KETONES UR: 80 mg/dL — AB
LEUKOCYTES UA: NEGATIVE
NITRITE: NEGATIVE
Protein, ur: >=300 mg/dL — AB
SPECIFIC GRAVITY, URINE: 1.032 — AB (ref 1.005–1.030)
pH: 6 (ref 5.0–8.0)

## 2018-03-15 LAB — CBC
HCT: 49.9 % — ABNORMAL HIGH (ref 36.0–46.0)
Hemoglobin: 18.2 g/dL — ABNORMAL HIGH (ref 12.0–15.0)
MCH: 28 pg (ref 26.0–34.0)
MCHC: 36.5 g/dL — ABNORMAL HIGH (ref 30.0–36.0)
MCV: 76.9 fL — ABNORMAL LOW (ref 78.0–100.0)
PLATELETS: 124 10*3/uL — AB (ref 150–400)
RBC: 6.49 MIL/uL — ABNORMAL HIGH (ref 3.87–5.11)
RDW: 15.5 % (ref 11.5–15.5)
WBC: 11.4 10*3/uL — ABNORMAL HIGH (ref 4.0–10.5)

## 2018-03-15 LAB — COMPREHENSIVE METABOLIC PANEL
ALBUMIN: 5.2 g/dL — AB (ref 3.5–5.0)
ALT: 21 U/L (ref 0–44)
ANION GAP: 14 (ref 5–15)
AST: 24 U/L (ref 15–41)
Alkaline Phosphatase: 40 U/L (ref 38–126)
BUN: 18 mg/dL (ref 6–20)
CHLORIDE: 96 mmol/L — AB (ref 98–111)
CO2: 23 mmol/L (ref 22–32)
Calcium: 9.8 mg/dL (ref 8.9–10.3)
Creatinine, Ser: 0.99 mg/dL (ref 0.44–1.00)
GFR calc Af Amer: >60 mL/min (ref >60)
GLUCOSE: 98 mg/dL (ref 70–99)
Potassium: 3.2 mmol/L — ABNORMAL LOW (ref 3.5–5.1)
Sodium: 133 mmol/L — ABNORMAL LOW (ref 135–145)
Total Bilirubin: 1.7 mg/dL — ABNORMAL HIGH (ref 0.3–1.2)
Total Protein: 9.9 g/dL — ABNORMAL HIGH (ref 6.5–8.1)

## 2018-03-15 LAB — HCG, QUANTITATIVE, PREGNANCY: HCG, BETA CHAIN, QUANT, S: 83801 m[IU]/mL — AB (ref <5)

## 2018-03-15 MED ORDER — M.V.I. ADULT IV INJ
Freq: Once | INTRAVENOUS | Status: DC
  Filled 2018-03-15: qty 10

## 2018-03-15 MED ORDER — LACTATED RINGERS IV SOLN
INTRAVENOUS | Status: DC
  Administered 2018-03-15: 21:00:00 via INTRAVENOUS

## 2018-03-15 MED ORDER — SODIUM CHLORIDE 0.9 % IV SOLN
INTRAVENOUS | Status: DC
  Administered 2018-03-15 (×2): via INTRAVENOUS

## 2018-03-15 MED ORDER — PROMETHAZINE HCL 25 MG/ML IJ SOLN
12.5000 mg | Freq: Once | INTRAMUSCULAR | Status: AC
Start: 2018-03-15 — End: 2018-03-15
  Administered 2018-03-15: 12.5 mg via INTRAVENOUS
  Filled 2018-03-15: qty 1

## 2018-03-15 NOTE — MAU Note (Signed)
PT SAYS SHE HAD A TAB - WHILE  2 MTTHS  PREG  IN JAN 2019.    SHE HAS SLEEPING DISORDER -  AND WENT  TO DR 2 WEEKS AGO- PREG TEST  POSITIVE.  HAS NOT  EATEN  SINCE  LAST SAT .   CANNOT NOT TAKE MEDS  FOR SLEEP BC OF PREG.

## 2018-03-15 NOTE — MAU Provider Note (Signed)
Chief Complaint: Emesis   First Provider Initiated Contact with Patient 03/15/18 2029        SUBJECTIVE HPI: Cynthia [name] is a 27 y.o. [id number] at 6638w2d  by LMP who presents to maternity admissions reporting nausea, vomiting and ptyalism for several weeks but worse since Friday (4 days).  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  Unsure if she will keep pregnancy   Last saw Dr Gaynell FaceMarshall before he retired. .Has a history of hyperemesis with prior pregnancies She denies vaginal bleeding, vaginal itching/burning, urinary symptoms, h/a, dizziness, or fever/chills.  Did not report abdominal pain on triage or to me, but did tell other nurse she had abdominal pain.   Emesis   This is a recurrent problem. The current episode started in the past 7 days. The problem has been unchanged. There has been no fever. Associated symptoms include abdominal pain, dizziness and weight loss. Pertinent negatives include no chest pain, chills, diarrhea, fever or myalgias. She has tried nothing for the symptoms.   RN Note: PT SAYS SHE HAD A TAB - WHILE  2 MTTHS  PREG  IN JAN 2019.    SHE HAS SLEEPING DISORDER -  AND WENT  TO DR 2 WEEKS AGO- PREG TEST  POSITIVE.  HAS NOT  EATEN  SINCE  LAST SAT .   CANNOT NOT TAKE MEDS  FOR SLEEP BC OF PREG    Past Medical History:  Diagnosis Date  . Narcolepsy    Past Surgical History:  Procedure Laterality Date  . DILATION AND CURETTAGE OF UTERUS     Social History   Socioeconomic History  . Marital status: Single    Spouse name: Not on file  . Number of children: 1  . Years of education: Not on file  . Highest education level: Not on file  Occupational History  . Occupation: Consulting civil engineertudent    Comment: [employer]  Social Needs  . Financial resource strain: Not on file  . Food insecurity:    Worry: Not on file    Inability: Not on file  . Transportation needs:    Medical: Not on file    Non-medical: Not on file  Tobacco Use  . Smoking  status: Never Smoker  . Smokeless tobacco: Never Used  Substance and Sexual Activity  . Alcohol use: No    Comment: socially  . Drug use: No    Frequency: 7.0 times per week    Types: Marijuana  . Sexual activity: Yes    Birth control/protection: None  Lifestyle  . Physical activity:    Days per week: Not on file    Minutes per session: Not on file  . Stress: Not on file  Relationships  . Social connections:    Talks on phone: Not on file    Gets together: Not on file    Attends religious service: Not on file    Active member of club or organization: Not on file    Attends meetings of clubs or organizations: Not on file    Relationship status: Not on file  . Intimate partner violence:    Fear of current or ex partner: Not on file    Emotionally abused: Not on file    Physically abused: Not on file    Forced sexual activity: Not on file  Other Topics Concern  . Not on file  Social History Narrative  . Not on file   No current facility-administered medications on file  prior to encounter.    Current Outpatient Medications on File Prior to Encounter  Medication Sig Dispense Refill  . amphetamine-dextroamphetamine (ADDERALL XR) 20 MG 24 hr capsule Take 1 capsule (20 mg total) by mouth daily. 30 capsule 0   No Known Allergies  I have reviewed patient's Past Medical Hx, Surgical Hx, Family Hx, Social Hx, medications and allergies.   ROS:  Review of Systems  Constitutional: Positive for unexpected weight change and weight loss. Negative for chills and fever.  Cardiovascular: Negative for chest pain.  Gastrointestinal: Positive for abdominal pain and vomiting. Negative for diarrhea.       Excessive spitting/ptyalism  Genitourinary: Positive for pelvic pain.  Musculoskeletal: Negative for myalgias.  Neurological: Positive for dizziness.   Review of Systems  Other systems negative   Physical Exam  Physical Exam Patient Vitals for the past 24 hrs:  BP Temp Temp src  Pulse Resp Height Weight  03/15/18 2023 (!) 148/100 98.3 F (36.8 C) Oral (!) 101 (!) 22 4\' 11"  (1.499 m) 127 lb (57.6 kg)   Constitutional: Well-developed, weak-appearing female in no acute distress.  Cardiovascular: Tachycardic, RRR Respiratory: normal effort, lungs CTAB GI: Abd soft, non-tender. Pos BS x 4 MS: Extremities nontender, no edema, normal ROM Neurologic: Alert and oriented x 4.  GU: Neg CVAT.  PELVIC EXAM: deferred  FHT unable to be obtained by doppler  LAB RESULTS Results for orders placed or performed during the hospital encounter of 03/15/18 (from the past 24 hour(s))  Urinalysis, Routine w reflex microscopic     Status: Abnormal   Collection Time: 03/15/18  8:30 PM  Result Value Ref Range   Color, Urine AMBER (A) YELLOW   APPearance HAZY (A) CLEAR   Specific Gravity, Urine 1.032 (H) 1.005 - 1.030   pH 6.0 5.0 - 8.0   Glucose, UA NEGATIVE NEGATIVE mg/dL   Hgb urine dipstick NEGATIVE NEGATIVE   Bilirubin Urine NEGATIVE NEGATIVE   Ketones, ur 80 (A) NEGATIVE mg/dL   Protein, ur >=161 (A) NEGATIVE mg/dL   Nitrite NEGATIVE NEGATIVE   Leukocytes, UA NEGATIVE NEGATIVE   RBC / HPF 0-5 0 - 5 RBC/hpf   WBC, UA 6-10 0 - 5 WBC/hpf   Bacteria, UA RARE (A) NONE SEEN   Squamous Epithelial / LPF 6-10 0 - 5   Mucus PRESENT   hCG, quantitative, pregnancy     Status: Abnormal   Collection Time: 03/15/18  8:38 PM  Result Value Ref Range   hCG, Beta Chain, Quant, S 83,801 (H) <5 mIU/mL  Comprehensive metabolic panel     Status: Abnormal   Collection Time: 03/15/18  8:38 PM  Result Value Ref Range   Sodium 133 (L) 135 - 145 mmol/L   Potassium 3.2 (L) 3.5 - 5.1 mmol/L   Chloride 96 (L) 98 - 111 mmol/L   CO2 23 22 - 32 mmol/L   Glucose, Bld 98 70 - 99 mg/dL   BUN 18 6 - 20 mg/dL   Creatinine, Ser 0.96 0.44 - 1.00 mg/dL   Calcium 9.8 8.9 - 04.5 mg/dL   Total Protein 9.9 (H) 6.5 - 8.1 g/dL   Albumin 5.2 (H) 3.5 - 5.0 g/dL   AST 24 15 - 41 U/L   ALT 21 0 - 44 U/L    Alkaline Phosphatase 40 38 - 126 U/L   Total Bilirubin 1.7 (H) 0.3 - 1.2 mg/dL   GFR calc non Af Amer >60 >60 mL/min   GFR calc Af Amer >60 >60  mL/min   Anion gap 14 5 - 15  Urine rapid drug screen (hosp performed)     Status: Abnormal   Collection Time: 03/15/18  8:38 PM  Result Value Ref Range   Opiates NONE DETECTED NONE DETECTED   Cocaine NONE DETECTED NONE DETECTED   Benzodiazepines NONE DETECTED NONE DETECTED   Amphetamines NONE DETECTED NONE DETECTED   Tetrahydrocannabinol POSITIVE (A) NONE DETECTED   Barbiturates (A) NONE DETECTED    Result not available. Reagent lot number recalled by manufacturer.  CBC     Status: Abnormal   Collection Time: 03/15/18  8:53 PM  Result Value Ref Range   WBC 11.4 (H) 4.0 - 10.5 K/uL   RBC 6.49 (H) 3.87 - 5.11 MIL/uL   Hemoglobin 18.2 (H) 12.0 - 15.0 g/dL   HCT 16.1 (H) 09.6 - 04.5 %   MCV 76.9 (L) 78.0 - 100.0 fL   MCH 28.0 26.0 - 34.0 pg   MCHC 36.5 (H) 30.0 - 36.0 g/dL   RDW 40.9 81.1 - 91.4 %   Platelets 124 (L) 150 - 400 K/uL  CBC with Differential/Platelet     Status: Abnormal   Collection Time: 03/16/18  3:07 AM  Result Value Ref Range   WBC 12.6 (H) 4.0 - 10.5 K/uL   RBC 3.83 (L) 3.87 - 5.11 MIL/uL   Hemoglobin 10.7 (L) 12.0 - 15.0 g/dL   HCT 78.2 (L) 95.6 - 21.3 %   MCV 76.8 (L) 78.0 - 100.0 fL   MCH 27.9 26.0 - 34.0 pg   MCHC 36.4 (H) 30.0 - 36.0 g/dL   RDW 08.6 57.8 - 46.9 %   Platelets 164 150 - 400 K/uL   Neutrophils Relative % 71 %   Neutro Abs 9.0 1.7 - 7.7 K/uL   Lymphocytes Relative 21 %   Lymphs Abs 2.6 0.7 - 4.0 K/uL   Monocytes Relative 8 %   Monocytes Absolute 1.0 0.1 - 1.0 K/uL   Eosinophils Relative 0 %   Eosinophils Absolute 0.0 0.0 - 0.7 K/uL   Basophils Relative 0 %   Basophils Absolute 0.0 0.0 - 0.1 K/uL  Comprehensive metabolic panel     Status: Abnormal   Collection Time: 03/16/18  3:07 AM  Result Value Ref Range   Sodium 134 (L) 135 - 145 mmol/L   Potassium 3.0 (L) 3.5 - 5.1 mmol/L   Chloride  104 98 - 111 mmol/L   CO2 19 (L) 22 - 32 mmol/L   Glucose, Bld 84 70 - 99 mg/dL   BUN 14 6 - 20 mg/dL   Creatinine, Ser 6.29 0.44 - 1.00 mg/dL   Calcium 7.9 (L) 8.9 - 10.3 mg/dL   Total Protein 6.8 6.5 - 8.1 g/dL   Albumin 3.5 3.5 - 5.0 g/dL   AST 17 15 - 41 U/L   ALT 17 0 - 44 U/L   Alkaline Phosphatase 27 (L) 38 - 126 U/L   Total Bilirubin 1.5 (H) 0.3 - 1.2 mg/dL   GFR calc non Af Amer >60 >60 mL/min   GFR calc Af Amer >60 >60 mL/min   Anion gap 11 5 - 15  Save smear     Status: None   Collection Time: 03/16/18  3:07 AM  Result Value Ref Range   Smear Review SMEAR STAINED AND AVAILABLE FOR REVIEW        IMAGING US Ob Comp Less 14 Wks  Result Date: 03/16/2018 CLINICAL DATA:  27 year old pregnant female with pelvic pain. LMP: 01/31/2018  corresponding to an estimated gestational age of [redacted] weeks, 2 days. EXAM: OBSTETRIC <14 WK [place] AND TRANSVAGINAL OB US TECHNIQUE: Both transabdominal and transvaginal ultrasound examinations were performed for complete evaluation of the gestation as well as the maternal uterus, adnexal regions, and pelvic cul-de-sac. Transvaginal technique was performed to assess early pregnancy. COMPARISON:  None FINDINGS: Intrauterine gestational sac: Single Yolk sac:  Seen Embryo:  Present Cardiac Activity: Detected Heart Rate: 134 bpm CRL: 8 mm   6 w   4 d                  [place] EDC: 11/05/2018 Subchorionic hemorrhage: There is a small subchorionic hemorrhage measuring 16 x 6 mm. Maternal uterus/adnexae: The maternal ovaries appear unremarkable. There is a corpus luteum in the left ovary. IMPRESSION: Single live intrauterine pregnancy with an estimated gestational age of [redacted] weeks, 4 days. Small subchorionic hemorrhage identified. Follow-up recommended. Electronically Signed   By: Elgie Collard M.D.   On: 03/16/2018 02:22     MAU Management/MDM: Ordered usual first trimester r/o ectopic labs.   cultures done on urine. Will check baseline Ultrasound to rule out ectopic.   US showed single live IUP  This bleeding/pain can represent a normal pregnancy with bleeding, spontaneous abortion or even an ectopic which can be life-threatening.  The process as listed above helps to determine which of these is present.   Treatments in MAU included 4 liters of IV fluid.  Phenergan for nausea.  Able to keep down apple juice afterward  Initial labs were abnormal but normalized after hydration. We did give 20 mEq of KCL for hypokalemia.  Potassium went down after fluids, likely due to dehydration. But Elevated Creat did normalize as did hyperconcentrated Hgb/Hct. .   Patient very somnolent during most of MAU stay.  Was arousable toward the end.   ASSESSMENT Single IUP at [redacted]w[redacted]d Nausea and vomiting with dehydration Hypokalemia Ptyalism New Hypertension, unexplained, possibly related to acute dehydration  PLAN Discharge home Rx Robinul for ptyalism Rx Diclegis for nausea Advance diet as tolerated Warned about cyclic vomiting syndrome with overuse of MJ (states quit with +UPT)  Pt stable at time of discharge. Encouraged to return here or to other Urgent Care/ED if she develops worsening of symptoms, increase in pain, fever, or other concerning symptoms.    Wynelle Bourgeois CNM, MSN Certified Nurse-Midwife 03/15/2018  8:29 PM

## 2018-03-16 ENCOUNTER — Inpatient Hospital Stay (HOSPITAL_COMMUNITY): Payer: Medicaid Other

## 2018-03-16 ENCOUNTER — Encounter (HOSPITAL_COMMUNITY): Payer: Self-pay | Admitting: Advanced Practice Midwife

## 2018-03-16 DIAGNOSIS — K117 Disturbances of salivary secretion: Secondary | ICD-10-CM

## 2018-03-16 DIAGNOSIS — E876 Hypokalemia: Secondary | ICD-10-CM

## 2018-03-16 DIAGNOSIS — O219 Vomiting of pregnancy, unspecified: Secondary | ICD-10-CM | POA: Diagnosis not present

## 2018-03-16 DIAGNOSIS — E860 Dehydration: Secondary | ICD-10-CM | POA: Diagnosis present

## 2018-03-16 HISTORY — DX: Disturbances of salivary secretion: K11.7

## 2018-03-16 HISTORY — DX: Vomiting of pregnancy, unspecified: O21.9

## 2018-03-16 HISTORY — DX: Hypokalemia: E87.6

## 2018-03-16 HISTORY — DX: Dehydration: E86.0

## 2018-03-16 LAB — COMPREHENSIVE METABOLIC PANEL
ALK PHOS: 27 U/L — AB (ref 38–126)
ALT: 17 U/L (ref 0–44)
ANION GAP: 11 (ref 5–15)
AST: 17 U/L (ref 15–41)
Albumin: 3.5 g/dL (ref 3.5–5.0)
BUN: 14 mg/dL (ref 6–20)
CALCIUM: 7.9 mg/dL — AB (ref 8.9–10.3)
CHLORIDE: 104 mmol/L (ref 98–111)
CO2: 19 mmol/L — AB (ref 22–32)
Creatinine, Ser: 0.68 mg/dL (ref 0.44–1.00)
GFR calc non Af Amer: >60 mL/min (ref >60)
Glucose, Bld: 84 mg/dL (ref 70–99)
Potassium: 3 mmol/L — ABNORMAL LOW (ref 3.5–5.1)
Sodium: 134 mmol/L — ABNORMAL LOW (ref 135–145)
Total Bilirubin: 1.5 mg/dL — ABNORMAL HIGH (ref 0.3–1.2)
Total Protein: 6.8 g/dL (ref 6.5–8.1)

## 2018-03-16 LAB — CBC WITH DIFFERENTIAL/PLATELET
Basophils Absolute: 0 10*3/uL (ref 0.0–0.1)
Basophils Relative: 0 %
EOS PCT: 0 %
Eosinophils Absolute: 0 10*3/uL (ref 0.0–0.7)
HCT: 29.4 % — ABNORMAL LOW (ref 36.0–46.0)
Hemoglobin: 10.7 g/dL — ABNORMAL LOW (ref 12.0–15.0)
LYMPHS ABS: 2.6 10*3/uL (ref 0.7–4.0)
Lymphocytes Relative: 21 %
MCH: 27.9 pg (ref 26.0–34.0)
MCHC: 36.4 g/dL — AB (ref 30.0–36.0)
MCV: 76.8 fL — ABNORMAL LOW (ref 78.0–100.0)
MONOS PCT: 8 %
Monocytes Absolute: 1 10*3/uL (ref 0.1–1.0)
Neutro Abs: 9 10*3/uL (ref 1.7–7.7)
Neutrophils Relative %: 71 %
PLATELETS: 164 10*3/uL (ref 150–400)
RBC: 3.83 MIL/uL — ABNORMAL LOW (ref 3.87–5.11)
RDW: 15.4 % (ref 11.5–15.5)
WBC: 12.6 10*3/uL — ABNORMAL HIGH (ref 4.0–10.5)

## 2018-03-16 LAB — RAPID URINE DRUG SCREEN, HOSP PERFORMED
Amphetamines: NOT DETECTED
Benzodiazepines: NOT DETECTED
Cocaine: NOT DETECTED
Opiates: NOT DETECTED
TETRAHYDROCANNABINOL: POSITIVE — AB

## 2018-03-16 LAB — SAVE SMEAR

## 2018-03-16 MED ORDER — DOXYLAMINE-PYRIDOXINE 10-10 MG PO TBEC: 1.0000 | DELAYED_RELEASE_TABLET | Freq: Every evening | ORAL | 1 refills | Status: DC | PRN

## 2018-03-16 MED ORDER — GLYCOPYRROLATE 1 MG PO TABS: 1.0000 mg | ORAL_TABLET | Freq: Three times a day (TID) | ORAL | 0 refills | Status: DC | PRN

## 2018-03-16 MED ORDER — POTASSIUM CHLORIDE 10 MEQ/100ML IV SOLN
10.0000 meq | INTRAVENOUS | Status: AC
  Administered 2018-03-16 (×2): 10 meq via INTRAVENOUS
  Filled 2018-03-16 (×2): qty 100

## 2018-03-16 MED ORDER — SODIUM CHLORIDE 0.9 % IV SOLN
INTRAVENOUS | Status: DC
  Administered 2018-03-16: 02:00:00 via INTRAVENOUS

## 2018-03-16 NOTE — Discharge Instructions (Signed)
Morning Sickness °Morning sickness is when you feel sick to your stomach (nauseous) during pregnancy. This nauseous feeling may or may not come with vomiting. It often occurs in the morning but can be a problem any time of day. Morning sickness is most common during the first trimester, but it may continue throughout pregnancy. While morning sickness is unpleasant, it is usually harmless unless you develop severe and continual vomiting (hyperemesis gravidarum). This condition requires more intense treatment. °What are the causes? °The cause of morning sickness is not completely known but seems to be related to normal hormonal changes that occur in pregnancy. °What increases the risk? °You are at greater risk if you: °· Experienced nausea or vomiting before your pregnancy. °· Had morning sickness during a previous pregnancy. °· Are pregnant with more than one baby, such as twins. ° °How is this treated? °Do not use any medicines (prescription, over-the-counter, or herbal) for morning sickness without first talking to your health care provider. Your health care provider may prescribe or recommend: °· Vitamin B6 supplements. °· Anti-nausea medicines. °· The herbal medicine ginger. ° °Follow these instructions at home: °· Only take over-the-counter or prescription medicines as directed by your health care provider. °· Taking multivitamins before getting pregnant can prevent or decrease the severity of morning sickness in most women. °· Eat a piece of dry toast or unsalted crackers before getting out of bed in the morning. °· Eat five or six small meals a day. °· Eat dry and bland foods (rice, baked potato). Foods high in carbohydrates are often helpful. °· Do not drink liquids with your meals. Drink liquids between meals. °· Avoid greasy, fatty, and spicy foods. °· Get someone to cook for you if the smell of any food causes nausea and vomiting. °· If you feel nauseous after taking prenatal vitamins, take the vitamins at  night or with a snack. °· Snack on protein foods (nuts, yogurt, cheese) between meals if you are hungry. °· Eat unsweetened gelatins for desserts. °· Wearing an acupressure wristband (worn for sea sickness) may be helpful. °· Acupuncture may be helpful. °· Do not smoke. °· Get a humidifier to keep the air in your house free of odors. °· Get plenty of fresh air. °Contact a health care provider if: °· Your home remedies are not working, and you need medicine. °· You feel dizzy or lightheaded. °· You are losing weight. °Get help right away if: °· You have persistent and uncontrolled nausea and vomiting. °· You pass out (faint). °This information is not intended to replace advice given to you by your health care provider. Make sure you discuss any questions you have with your health care provider. °Document Released: 10/29/2006 Document Revised: 02/13/2016 Document Reviewed: 02/22/2013 °Elsevier Interactive Patient Education © 2017 [employer] Inc. ° °

## 2018-03-23 ENCOUNTER — Telehealth: Payer: Self-pay | Admitting: *Deleted

## 2018-03-23 NOTE — Telephone Encounter (Signed)
Received fax for diclegis prior auth, completed and sent.

## 2018-04-05 ENCOUNTER — Telehealth: Payer: Self-pay | Admitting: Internal Medicine

## 2018-04-05 NOTE — Telephone Encounter (Signed)
Ok refill. She will need to be seen within 1 year from last visit

## 2018-04-05 NOTE — Telephone Encounter (Signed)
Pt is requesting a refill on Adderall XR 20mg . Her last OV was on 02/24/18. She does not have a pending OV at this time. Her last refill was on 02/24/18 #30 by TP.  CY - please advise. Thanks.

## 2018-04-06 MED ORDER — AMPHETAMINE-DEXTROAMPHET ER 20 MG PO CP24: 20.0000 mg | ORAL_CAPSULE | Freq: Every day | ORAL | 0 refills | Status: DC

## 2018-04-06 NOTE — Telephone Encounter (Signed)
Rx has been printed, signed and placed up front in the outgoing mail. Pt is aware. Nothing further was needed.

## 2018-05-19 ENCOUNTER — Telehealth: Payer: Self-pay | Admitting: Internal Medicine

## 2018-05-19 NOTE — Telephone Encounter (Signed)
Sorry. Saw this after clinic was done. Can you send to [facility] tomorrow 8/30?

## 2018-05-19 NOTE — Telephone Encounter (Signed)
Spoke with pt. She is needing a refill on Adderall.  This was last filled on 04/06/18 #30.  Dr. Isaiah SergeMannam - please advise as Dr. Maple HudsonYoung is not available. Thanks.

## 2018-05-20 MED ORDER — AMPHETAMINE-DEXTROAMPHET ER 20 MG PO CP24: 20.0000 mg | ORAL_CAPSULE | Freq: Every day | ORAL | 0 refills | Status: DC

## 2018-05-20 NOTE — Telephone Encounter (Signed)
Ok x one

## 2018-05-20 NOTE — Telephone Encounter (Signed)
Per Dr. Sherene SiresWert- Ok x one .  Prescription printed for Dr. Sherene SiresWert to sign and Patient notified that prescription was placed at front desk for pick up.  Nothing further at this time.

## 2018-05-20 NOTE — Telephone Encounter (Signed)
MW please advise on refill. Thanks. 

## 2018-05-24 ENCOUNTER — Telehealth: Payer: Self-pay | Admitting: Internal Medicine

## 2018-05-24 NOTE — Telephone Encounter (Signed)
Spoke with patient, patient aware prescription has been placed upfront in outgoing mail. Prescription obtained from accordion folder and placed in out going mail. Nothing further needed at this time.

## 2018-05-31 ENCOUNTER — Encounter: Payer: Self-pay | Admitting: *Deleted

## 2018-06-29 ENCOUNTER — Telehealth: Payer: Self-pay | Admitting: Internal Medicine

## 2018-06-29 NOTE — Telephone Encounter (Signed)
lmtcb x1 for pt. 

## 2018-06-30 NOTE — Telephone Encounter (Signed)
Pt is calling back [phone] 

## 2018-06-30 NOTE — Telephone Encounter (Signed)
Called spoke with patient regarding adderall refill. she states she is not pregnant, asked verification since computer states she is 21w and 3d.  Dr. Maple Hudson please advise on refill of Adderall  No Known Allergies Current Outpatient Medications on File Prior to Visit  Medication Sig Dispense Refill  . amphetamine-dextroamphetamine (ADDERALL XR) 20 MG 24 hr capsule Take 1 capsule (20 mg total) by mouth daily. 30 capsule 0  . Doxylamine-Pyridoxine (DICLEGIS) 10-10 MG TBEC Take 1 tablet by mouth at bedtime as needed. 100 tablet 1  . glycopyrrolate (ROBINUL) 1 MG tablet Take 1 tablet (1 mg total) by mouth every 8 (eight) hours as needed. 30 tablet 0   No current facility-administered medications on file prior to visit.

## 2018-06-30 NOTE — Telephone Encounter (Signed)
Called patient unable to reach left message to give us a call back.

## 2018-06-30 NOTE — Telephone Encounter (Signed)
Pt is calling back [phone]

## 2018-06-30 NOTE — Telephone Encounter (Signed)
Left message for patient to call back. Need to verify if patient is still pregnant or not. Per her records, she is.

## 2018-07-01 NOTE — Telephone Encounter (Signed)
The last documentation we find indicates she is pregnant. We can't risk harming the baby. We either need to order a blood test for pregnancy,  Or a current note from her OB-GYN that either states she is not pregnant, or gives [place] permission to give her adderall in spite of her pregnancy.

## 2018-07-01 NOTE — Telephone Encounter (Signed)
Called and spoke with pt stating the information per CY about due to her chart showing that she is pregnant, we are not refilling adderall at this time as we can not risk harming the baby.  Per pt, she has already had the baby and is no longer pregnant. I stated to pt we could either order a blood test to check for pregnancy or have her get a note from OB-GYN that either states she is not pregnant or that will give [place] permission to give her the adderall Rx if she is still pregnant.  Pt was not happy with the information stated and but did acknowledge understanding what was said. Nothing further needed.

## 2018-07-28 ENCOUNTER — Ambulatory Visit: Payer: Medicaid Other | Admitting: Pulmonary Disease

## 2018-07-28 NOTE — Progress Notes (Deleted)
@Patient  ID: Cynthia [name], female    DOB: 02/22/1991, 27 y.o.   MRN: [id number]  No chief complaint on file.   Referring provider: No ref. provider found  HPI:  27 yo female never smoker followed for Nacrolepsy with hypersomnia   PMH:  Smoker/ Smoking History: Never Smoker  Maintenance:  Adderal 20mg   Pt of: Dr. Maple Hudson  Recent [facility] Encounters:   02/24/2018 Follow up : Narcolepsy w/ Hypersomnia  Patient presents for a one-year follow-up.  She is followed for narcolepsy with hypersomnia.  She has previously been maintained on Adderall XR 20 mill grams daily.  She says she has not taken in long time but does not feel it works for her . She falls asleep throughout the day . It was hard to determine how consistent she takes as she was on her cell phone the entire office visit. She was very angry that she is not better. I tried to explain to the patient that we will need to see her on a consistent basis and monitor the effectiveness of medications in order to evaluate  No evidence of seizure activity or cataplexy Plan: adderall 20mg  XR   07/28/2018  - Visit   HPI  Tests:   NPSG- 10/09/09- AHI 0.2 per hour, RDI 20.4 per hour intermittent moderate snoring- MSLT 07/06/11- mean latency 2.1 minutes with SOREM  5   FENO:  No results found for: NITRICOXIDE  PFT: No flowsheet data found.  Imaging: No results found.  Chart Review:    Specialty Problems    None      No Known Allergies  Immunization History  Administered Date(s) Administered  . Influenza Split 06/21/2014    Past Medical History:  Diagnosis Date  . Narcolepsy     Tobacco History: Social History   Tobacco Use  Smoking Status Never Smoker  Smokeless Tobacco Never Used   Counseling given: Not Answered   Outpatient Encounter Medications as of 07/28/2018  Medication Sig  . amphetamine-dextroamphetamine (ADDERALL XR) 20 MG 24 hr capsule Take 1 capsule (20 mg total) by mouth daily.  .  Doxylamine-Pyridoxine (DICLEGIS) 10-10 MG TBEC Take 1 tablet by mouth at bedtime as needed.  Marland Kitchen glycopyrrolate (ROBINUL) 1 MG tablet Take 1 tablet (1 mg total) by mouth every 8 (eight) hours as needed.   No facility-administered encounter medications on file as of 07/28/2018.      Review of Systems  Review of Systems   Physical Exam  LMP 01/31/2018   Wt Readings from Last 5 Encounters:  03/15/18 127 lb (57.6 kg)  02/24/18 136 lb (61.7 kg)  09/20/17 130 lb (59 kg)  12/15/16 136 lb 6.4 oz (61.9 kg)  12/30/15 126 lb 12.8 oz (57.5 kg)     Physical Exam    Lab Results:  CBC    Component Value Date/Time   WBC 12.6 (H) 03/16/2018 0307   RBC 3.83 (L) 03/16/2018 0307   HGB 10.7 (L) 03/16/2018 0307   HCT 29.4 (L) 03/16/2018 0307   PLT 164 03/16/2018 0307   MCV 76.8 (L) 03/16/2018 0307   MCH 27.9 03/16/2018 0307   MCHC 36.4 (H) 03/16/2018 0307   RDW 15.4 03/16/2018 0307   LYMPHSABS 2.6 03/16/2018 0307   MONOABS 1.0 03/16/2018 0307   EOSABS 0.0 03/16/2018 0307   BASOSABS 0.0 03/16/2018 0307    BMET    Component Value Date/Time   NA 134 (L) 03/16/2018 0307   K 3.0 (L) 03/16/2018 0307   CL 104  03/16/2018 0307   CO2 19 (L) 03/16/2018 0307   GLUCOSE 84 03/16/2018 0307   BUN 14 03/16/2018 0307   CREATININE 0.68 03/16/2018 0307   CALCIUM 7.9 (L) 03/16/2018 0307   GFRNONAA >60 03/16/2018 0307   GFRAA >60 03/16/2018 0307    BNP No results found for: BNP  ProBNP No results found for: PROBNP    Assessment & Plan:     No problem-specific Assessment & Plan notes found for this encounter.     Coral Ceo, NP 07/28/2018

## 2018-08-22 ENCOUNTER — Ambulatory Visit: Payer: Medicaid Other | Admitting: Adult Health

## 2018-09-08 ENCOUNTER — Telehealth: Payer: Self-pay | Admitting: Internal Medicine

## 2018-09-08 DIAGNOSIS — G47419 Narcolepsy without cataplexy: Secondary | ICD-10-CM

## 2018-09-08 NOTE — Telephone Encounter (Signed)
Urine should be ok

## 2018-09-08 NOTE — Telephone Encounter (Signed)
Dr. Maple HudsonYoung, do you want this to be a serum pregnancy test or a urine pregnancy test?

## 2018-09-08 NOTE — Telephone Encounter (Signed)
Called spoke with patient She will come tomorrow for urine pregnancy test Order placed Nothing further needed; will sign off

## 2018-09-08 NOTE — Telephone Encounter (Signed)
Dr. Maple HudsonYoung, please advise if you are okay to order pregnancy test for pt as her chart is showing that she is pregnant and from phone note 07/01/18, pt stated then that she already had her baby. Pt is wanting a refill of her adderall and that is why she is asking if she can have a pregnancy test ordered to prove that she is not pregnant.

## 2018-09-08 NOTE — Telephone Encounter (Signed)
Ok to order pregnancy test with dx narcolepsy

## 2018-11-01 ENCOUNTER — Telehealth: Payer: Self-pay | Admitting: Internal Medicine

## 2018-11-01 NOTE — Telephone Encounter (Signed)
Called and spoke with Lynden Ang, [facility] [place], [place].  Cathy needed clarification of medications and provider. Adderall 20mg , take 1 daily,clarified and provider Dr Maple Hudson.  Understanding stated. Nothing further at this time.

## 2018-11-17 ENCOUNTER — Other Ambulatory Visit: Payer: Medicaid Other

## 2018-11-17 ENCOUNTER — Telehealth: Payer: Self-pay | Admitting: Internal Medicine

## 2018-11-17 DIAGNOSIS — G47419 Narcolepsy without cataplexy: Secondary | ICD-10-CM

## 2018-11-17 NOTE — Telephone Encounter (Signed)
Pt came in office needing urine pregnancy test, lab was already ordered. Cynthia [name], phlebotomist, is currently away from desk. Provided pt with cup and pt supplied urine sample, specimen was left on non-clean area in lab. Cynthia [name] is aware.   Will await results, pt is anxious. Will forward to triage to follow up on.

## 2018-11-17 NOTE — Telephone Encounter (Signed)
Labs are still pending.

## 2018-11-18 NOTE — Telephone Encounter (Signed)
Checked with Irma from the lab, she stated that the test had to be sent to [facility]. Still pending results at this time.

## 2018-11-18 NOTE — Telephone Encounter (Signed)
Labs are still pending as of this morning.

## 2018-11-20 LAB — PREGNANCY, URINE: Preg Test, Ur: NEGATIVE

## 2018-11-22 ENCOUNTER — Telehealth: Payer: Self-pay | Admitting: Internal Medicine

## 2018-11-22 MED ORDER — AMPHETAMINE-DEXTROAMPHET ER 20 MG PO CP24: 20.0000 mg | ORAL_CAPSULE | Freq: Every day | ORAL | 0 refills | Status: DC

## 2018-11-22 NOTE — Telephone Encounter (Signed)
Adderall refill e-sent 

## 2018-11-22 NOTE — Telephone Encounter (Signed)
Advised pt of results. Pt understood. She is requesting a refill on the Adderall to be sent to [facility] [address]. CY please advise.   Notes recorded by Waymon Budge, MD on 11/21/2018 at 9:45 AM EST Urine pregnancy test 4 days ago was negative

## 2018-11-22 NOTE — Telephone Encounter (Signed)
Spoke with pt and advised rx for Adderall was sent to pharmacy. Nothing further is needed.

## 2018-11-22 NOTE — Telephone Encounter (Signed)
Notes recorded by Waymon Budge, MD on 11/21/2018 at 9:45 AM EST Urine pregnancy test 4 days ago was negative ----------------------------------------- LMTCB x1 for pt.

## 2018-11-23 NOTE — Telephone Encounter (Signed)
LMTCB x2 for pt 

## 2018-11-24 NOTE — Telephone Encounter (Signed)
Called and spoke with patient, she stated that someone had already called her in regards to this. Patient stated that she will get her medication that was called in. Nothing further needed.

## 2019-01-30 ENCOUNTER — Encounter (HOSPITAL_COMMUNITY): Payer: Self-pay

## 2019-02-09 ENCOUNTER — Telehealth: Payer: Self-pay | Admitting: Internal Medicine

## 2019-02-09 MED ORDER — AMPHETAMINE-DEXTROAMPHET ER 20 MG PO CP24: 20.0000 mg | ORAL_CAPSULE | Freq: Every day | ORAL | 0 refills | Status: DC

## 2019-02-09 NOTE — Telephone Encounter (Signed)
Adderall refill e-sent 

## 2019-02-09 NOTE — Telephone Encounter (Signed)
Spoke with pt.  Needs adderall xr 20mg  refill sent to [facility] on [address]  Current Outpatient Medications on File Prior to Visit  Medication Sig Dispense Refill  . amphetamine-dextroamphetamine (ADDERALL XR) 20 MG 24 hr capsule Take 1 capsule (20 mg total) by mouth daily. 30 capsule 0  . Doxylamine-Pyridoxine (DICLEGIS) 10-10 MG TBEC Take 1 tablet by mouth at bedtime as needed. 100 tablet 1  . glycopyrrolate (ROBINUL) 1 MG tablet Take 1 tablet (1 mg total) by mouth every 8 (eight) hours as needed. 30 tablet 0   No current facility-administered medications on file prior to visit.     Last OV 02/24/2018  Script printed for Adderal 20 mg XR    1 each morning as needed- once lab test is back   Call us a couple of days ahead when you need refills, so you have time to pick it up.  Nap when you can.  Make sure to get enough sleep at night  Follow up with Dr. Maple Hudson  In 2-3 months and As needed    Last refill on 11/22/18 for 30 caps.  Dr Maple Hudson could you please e-scribe this?

## 2019-02-09 NOTE — Telephone Encounter (Signed)
Relayed to pt that script was sent in to preferred pharmacy.  [facility].  Instructed her to [facility] and call us back if anything is needed from [place].

## 2019-08-02 ENCOUNTER — Other Ambulatory Visit: Payer: Self-pay | Admitting: Internal Medicine

## 2019-08-02 ENCOUNTER — Telehealth: Payer: Self-pay | Admitting: Internal Medicine

## 2019-08-02 MED ORDER — AMPHETAMINE-DEXTROAMPHET ER 20 MG PO CP24: 20.0000 mg | ORAL_CAPSULE | Freq: Every day | ORAL | 0 refills | Status: DC

## 2019-08-02 NOTE — Telephone Encounter (Signed)
Spoke with pt. She is requesting a refill on Adderall. When I advised her that she would need an OV before we could refill this medication the pt became aggressive and started yelling at me. Stated, "I want to speak to Dr. Annamaria Boots personally." Pt has not seen any provider in our office since June 2019. Her last refill on Adderall was on 02/09/2019 #30.  CY - please advise on refill. Thanks.

## 2019-08-02 NOTE — Telephone Encounter (Signed)
I will send in a refill for her Adderall, but she must make a f/u with one of our sleep doctors before any further refills after this one.

## 2019-08-02 NOTE — Telephone Encounter (Signed)
Attempted to call pt but line went straight to VM. Left message for her to return call. 

## 2019-08-03 NOTE — Telephone Encounter (Signed)
Left message for patient to call back  

## 2019-08-04 NOTE — Telephone Encounter (Signed)
ATC pt, no answer. Left message for pt to call back.  

## 2019-08-07 NOTE — Telephone Encounter (Signed)
LMTCB

## 2019-08-08 NOTE — Telephone Encounter (Signed)
We have attempted to contact pt several times with no success or call back from pt. Per triage protocol, message will be closed.  

## 2019-09-01 ENCOUNTER — Emergency Department (HOSPITAL_COMMUNITY)
Admission: EM | Admit: 2019-09-01 | Discharge: 2019-09-01 | Disposition: A | Discharge status: Home / Self Care | Payer: Medicaid Other | Attending: Emergency Medicine | Admitting: Emergency Medicine

## 2019-09-01 ENCOUNTER — Other Ambulatory Visit: Payer: Self-pay

## 2019-09-01 ENCOUNTER — Encounter (HOSPITAL_COMMUNITY): Payer: Self-pay | Admitting: *Deleted

## 2019-09-01 DIAGNOSIS — R1115 Cyclical vomiting syndrome unrelated to migraine: Secondary | ICD-10-CM | POA: Insufficient documentation

## 2019-09-01 DIAGNOSIS — F1210 Cannabis abuse, uncomplicated: Secondary | ICD-10-CM | POA: Insufficient documentation

## 2019-09-01 DIAGNOSIS — R110 Nausea: Secondary | ICD-10-CM | POA: Diagnosis present

## 2019-09-01 DIAGNOSIS — Z79899 Other long term (current) drug therapy: Secondary | ICD-10-CM | POA: Insufficient documentation

## 2019-09-01 LAB — URINALYSIS, ROUTINE W REFLEX MICROSCOPIC
Bilirubin Urine: NEGATIVE
Glucose, UA: NEGATIVE mg/dL
Ketones, ur: 20 mg/dL — AB
Leukocytes,Ua: NEGATIVE
Nitrite: NEGATIVE
Protein, ur: >=300 mg/dL — AB
Specific Gravity, Urine: 1.033 — ABNORMAL HIGH (ref 1.005–1.030)
pH: 5 (ref 5.0–8.0)

## 2019-09-01 LAB — COMPREHENSIVE METABOLIC PANEL
ALT: 18 U/L (ref 0–44)
AST: 24 U/L (ref 15–41)
Albumin: 5 g/dL (ref 3.5–5.0)
Alkaline Phosphatase: 41 U/L (ref 38–126)
Anion gap: 13 (ref 5–15)
BUN: 23 mg/dL — ABNORMAL HIGH (ref 6–20)
CO2: 26 mmol/L (ref 22–32)
Calcium: 10.4 mg/dL — ABNORMAL HIGH (ref 8.9–10.3)
Chloride: 104 mmol/L (ref 98–111)
Creatinine, Ser: 1.05 mg/dL — ABNORMAL HIGH (ref 0.44–1.00)
GFR calc Af Amer: >60 mL/min (ref >60)
GFR calc non Af Amer: >60 mL/min (ref >60)
Glucose, Bld: 104 mg/dL — ABNORMAL HIGH (ref 70–99)
Potassium: 3.2 mmol/L — ABNORMAL LOW (ref 3.5–5.1)
Sodium: 143 mmol/L (ref 135–145)
Total Bilirubin: 0.8 mg/dL (ref 0.3–1.2)
Total Protein: 10 g/dL — ABNORMAL HIGH (ref 6.5–8.1)

## 2019-09-01 LAB — CBC
HCT: 43.9 % (ref 36.0–46.0)
Hemoglobin: 15.6 g/dL — ABNORMAL HIGH (ref 12.0–15.0)
MCH: 27.8 pg (ref 26.0–34.0)
MCHC: 35.5 g/dL (ref 30.0–36.0)
MCV: 78.1 fL — ABNORMAL LOW (ref 80.0–100.0)
Platelets: 209 10*3/uL (ref 150–400)
RBC: 5.62 MIL/uL — ABNORMAL HIGH (ref 3.87–5.11)
RDW: 14.6 % (ref 11.5–15.5)
WBC: 8.7 10*3/uL (ref 4.0–10.5)
nRBC: 0 % (ref 0.0–0.2)

## 2019-09-01 LAB — POC URINE PREG, ED: Preg Test, Ur: NEGATIVE

## 2019-09-01 LAB — LIPASE, BLOOD: Lipase: 34 U/L (ref 11–51)

## 2019-09-01 MED ORDER — HALOPERIDOL LACTATE 5 MG/ML IJ SOLN
2.0000 mg | Freq: Once | INTRAMUSCULAR | Status: AC
  Administered 2019-09-01: 2 mg via INTRAVENOUS
  Filled 2019-09-01: qty 1

## 2019-09-01 MED ORDER — SODIUM CHLORIDE 0.9 % IV BOLUS
1000.0000 mL | Freq: Once | INTRAVENOUS | Status: AC
  Administered 2019-09-01: 1000 mL via INTRAVENOUS

## 2019-09-01 MED ORDER — ONDANSETRON HCL 4 MG/2ML IJ SOLN
4.0000 mg | Freq: Once | INTRAMUSCULAR | Status: AC
  Administered 2019-09-01: 4 mg via INTRAVENOUS
  Filled 2019-09-01: qty 2

## 2019-09-01 MED ORDER — SODIUM CHLORIDE 0.9% FLUSH
3.0000 mL | Freq: Once | INTRAVENOUS | Status: AC
  Administered 2019-09-01: 3 mL via INTRAVENOUS

## 2019-09-01 NOTE — ED Provider Notes (Signed)
Care handoff received from [facility] PA-C at shift change please see her note for full details.  In short patient presented for 5 days of nausea and vomiting.  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  Pain-free, benign abdomen.  Laboratory work-up with evidence of dehydration.  Patient was given IV fluids and Zofran, continued to have nausea and Haldol was ordered.  Plan of care at shift change is to p.o. challenge, if successful discharge. Physical Exam  BP (!) 130/92   Pulse 86   Temp (!) 97.5 F (36.4 C) (Oral)   Resp 15   Ht 5' (1.524 m)   Wt 59.3 kg   SpO2 100%   BMI 25.55 kg/m   Physical Exam Constitutional:      General: She is not in acute distress.    Appearance: Normal appearance. She is well-developed. She is not ill-appearing or diaphoretic.  HENT:     Head: Normocephalic and atraumatic.     Right Ear: External ear normal.     Left Ear: External ear normal.     Nose: Nose normal.  Eyes:     General: Vision grossly intact. Gaze aligned appropriately.     Pupils: Pupils are equal, round, and reactive to light.  Neck:     Trachea: Trachea and phonation normal. No tracheal deviation.  Pulmonary:     Effort: Pulmonary effort is normal. No respiratory distress.  Abdominal:     Palpations: Abdomen is soft.     Tenderness: There is no abdominal tenderness. There is no guarding or rebound.  Musculoskeletal:     Cervical back: Normal range of motion.  Skin:    General: Skin is warm and dry.  Neurological:     Mental Status: She is alert.     GCS: GCS eye subscore is 4. GCS verbal subscore is 5. GCS motor subscore is 6.     Comments: Speech is clear and goal oriented, follows commands Major Cranial nerves without deficit, no facial droop Moves extremities without ataxia, coordination intact  Psychiatric:        Behavior: Behavior normal.     ED Course/Procedures     Procedures  MDM  4:05 PM: Patient assessed she is sitting up in bed resting  comfortably and speaking with her boyfriend on the phone.  She is telling her boyfriend to pull around the front of the ED to pick her up.  Patient reports that she is feeling improved after Haldol and has tolerated p.o. without recurrent nausea or vomiting.  She denies any pain and is requesting immediate discharge.  Patient advised of work-up today and to follow-up with her PCP for reassessment this week.  Patient advised to avoid marijuana as this may be a possible cause of her symptoms.  Patient advised to increase her water intake and to get rest.  As per previous team's plan patient will be discharged at this time.  As patient is pain-free and tolerating p.o. doubt appendicitis, small bowel obstruction, cholecystitis or other acute intra-abdominal etiology of patient's symptoms today, do not feel additional work-up is needed at this time.  At this time there does not appear to be any evidence of an acute emergency medical condition and the patient appears stable for discharge with appropriate outpatient follow up. Diagnosis was discussed with patient who verbalizes understanding of care plan and is agreeable to discharge. I have discussed return precautions with patient who verbalizes understanding of return precautions. Patient encouraged to follow-up with their PCP. All questions  answered.  AVS was printed, I returned to the room, informed by nurse tech patient had left the ED, she was stopped in the lobby and had IV removed by triage staff.  Note: Portions of this report may have been transcribed using voice recognition software. Every effort was made to ensure accuracy; however, inadvertent computerized transcription errors may still be present.   Deliah Boston, PA-C 09/01/19 [place], Ankit, MD 09/01/19 2016

## 2019-09-01 NOTE — ED Notes (Signed)
Pt spitting into emesis bag while ambulating to Room 2

## 2019-09-01 NOTE — ED Triage Notes (Addendum)
Pt presents stating she has been throwing up since Sunday, today she has tried to drink a shake and some ice water. Pt states she throws up about 6 times a day and it too weak to bathe. States she has not had a BM "probably say, maybe last week sometime" Drinks water at night, chicken noodle soup and broth in the day and "sucks on apples and pineapple to keep myself hydrated" PT BROUGHT IN SHAKE AND WATER, HAS BEEN INSTRUCTED NOT TO EAT OR DRINK.

## 2019-09-01 NOTE — ED Provider Notes (Signed)
[facility]-EMERGENCY DEPT Provider Note   CSN: [id number] Arrival date & time: 09/01/19  1002     History Chief Complaint  Patient presents with  . Emesis  . Nausea    Cynthia [name] is a 28 y.o. female past medical history significant for narcolepsy presents to emergency department today with chief complaint of nausea and emesis x 5 days. She has been unable to tolerate PO intake and has had decreased urinary output.  She has vomited too many times to count in the last 24 hours.  She states her emesis is nonbloody nonbilious.  She is also spitting frequently saying she wakes up during the night to spit to her back.  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 She has not taken any medications for her symptoms prior to arrival.   She denies any suspicious food intake.  Also denies fever, chills, chest pain, abdominal pain, gross hematuria, diarrhea, pelvic pain, vaginal discharge, abnormal vaginal bleeding.  LMP was the end of November, so her next cycle should be in approximately 1-2 weeks. Denies abdominal surgical history. She does admit to smoking marijuana daily, has not smoked since symptom onset.     Past Medical History:  Diagnosis Date  . Narcolepsy     Patient Active Problem List   Diagnosis Date Noted  . Nausea/vomiting in pregnancy 03/16/2018  . Dehydration 03/16/2018  . Hypokalemia 03/16/2018  . 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 DILATION AND CURETTAGE OF UTERUS       OB History    Gravida  7   Para  2   Term  2   Preterm      AB  4   Living  2     SAB      TAB  4   Ectopic      Multiple  0   Live Births  2           Family History  Problem Relation Age of Onset  . Hypertension Other     Social History   Tobacco Use  . Smoking status: Never Smoker  . Smokeless tobacco: Never Used  Substance Use Topics  .  Alcohol use: No    Comment: socially  . Drug use: No    Frequency: 7.0 times per week    Types: Marijuana    Home Medications Prior to Admission medications   Medication Sig Start Date End Date Taking? Authorizing Provider  amphetamine-dextroamphetamine (ADDERALL XR) 20 MG 24 hr capsule Take 1 capsule (20 mg total) by mouth daily. 08/02/19   Jetty DuhamelYoung, Clinton D, MD  Doxylamine-Pyridoxine (DICLEGIS) 10-10 MG TBEC Take 1 tablet by mouth at bedtime as needed. 03/16/18   Aviva SignsWilliams, Marie L, CNM  glycopyrrolate (ROBINUL) 1 MG tablet Take 1 tablet (1 mg total) by mouth every 8 (eight) hours as needed. 03/16/18   Aviva SignsWilliams, Marie L, CNM    Allergies    Patient has no known allergies.  Review of Systems   Review of Systems All other systems are reviewed and are negative for acute change except as noted in the HPI.  Physical Exam Updated Vital Signs BP (!) 150/122   Pulse (!) 107   Temp (!) 97.5 F (36.4 C) (Oral)   Resp 15   Ht 5' (1.524 m)   Wt 59.3 kg   SpO2 100%   BMI 25.55 kg/m   Physical  Exam Vitals and nursing note reviewed.  Constitutional:      General: She is not in acute distress.    Appearance: She is not ill-appearing.     Comments: Patient spitting numerous times into emesis bag during exam.  No active vomiting.  HENT:     Head: Normocephalic and atraumatic.     Right Ear: Tympanic membrane and external ear normal.     Left Ear: Tympanic membrane and external ear normal.     Nose: Nose normal.     Mouth/Throat:     Mouth: Mucous membranes are dry.     Pharynx: Oropharynx is clear.  Eyes:     General: No scleral icterus.       Right eye: No discharge.        Left eye: No discharge.     Extraocular Movements: Extraocular movements intact.     Conjunctiva/sclera: Conjunctivae normal.     Pupils: Pupils are equal, round, and reactive to light.  Neck:     Vascular: No JVD.  Cardiovascular:     Rate and Rhythm: Regular rhythm. Tachycardia present.     Pulses:  Normal pulses.          Radial pulses are 2+ on the right side and 2+ on the left side.     Heart sounds: Normal heart sounds.  Pulmonary:     Comments: Lungs clear to auscultation in all fields. Symmetric chest rise. No wheezing, rales, or rhonchi. Abdominal:     General: Bowel sounds are normal.     Tenderness: There is no right CVA tenderness or left CVA tenderness.     Comments: Abdomen is soft, non-distended, and non-tender in all quadrants. No rigidity, no guarding. No peritoneal signs.  Musculoskeletal:        General: Normal range of motion.     Cervical back: Normal range of motion.  Skin:    General: Skin is warm and dry.     Capillary Refill: Capillary refill takes less than 2 seconds.  Neurological:     Mental Status: She is oriented to person, place, and time.     GCS: GCS eye subscore is 4. GCS verbal subscore is 5. GCS motor subscore is 6.     Comments: Fluent speech, no facial droop.  Psychiatric:        Behavior: Behavior normal.       ED Results / Procedures / Treatments   Labs (all labs ordered are listed, but only abnormal results are displayed) Labs Reviewed  COMPREHENSIVE METABOLIC PANEL - Abnormal; Notable for the following components:      Result Value   Potassium 3.2 (*)    Glucose, Bld 104 (*)    BUN 23 (*)    Creatinine, Ser 1.05 (*)    Calcium 10.4 (*)    Total Protein 10.0 (*)    All other components within normal limits  CBC - Abnormal; Notable for the following components:   RBC 5.62 (*)    Hemoglobin 15.6 (*)    MCV 78.1 (*)    All other components within normal limits  URINALYSIS, ROUTINE W REFLEX MICROSCOPIC - Abnormal; Notable for the following components:   APPearance HAZY (*)    Specific Gravity, Urine 1.033 (*)    Hgb urine dipstick SMALL (*)    Ketones, ur 20 (*)    Protein, ur >=300 (*)    Bacteria, UA FEW (*)    All other components within normal limits  LIPASE, BLOOD  POC URINE PREG, ED    EKG None  Radiology No  results found.  Procedures Procedures (including critical care time)  Medications Ordered in ED Medications  sodium chloride flush (NS) 0.9 % injection 3 mL (3 mLs Intravenous Given 09/01/19 1152)  sodium chloride 0.9 % bolus 1,000 mL (0 mLs Intravenous Stopped 09/01/19 1300)  ondansetron (ZOFRAN) injection 4 mg (4 mg Intravenous Given 09/01/19 1315)  haloperidol lactate (HALDOL) injection 2 mg (2 mg Intravenous Given 09/01/19 1504)    ED Course  I have reviewed the triage vital signs and the nursing notes.  Pertinent labs & imaging results that were available during my care of the patient were reviewed by me and considered in my medical decision making (see chart for details).    MDM Rules/Calculators/A&P     CHA2DS2/VAS Stroke Risk Points      N/A >= 2 Points: High Risk  1 - 1.99 Points: Medium Risk  0 Points: Low Risk    A final score could not be computed because of missing components.: Last  Change: N/A  This score determines the patient's risk of having a stroke if the  patient has atrial fibrillation.  This score is not applicable to this patient. Components are not  calculated.   Patient seen and examined. Patient looks to be uncomfortable but is nontoxic in appearance.  She is afebrile.  She is tachycardic to 107. On my exam she has no abdominal tenderness, no peritoneal signs.  No CVA tenderness.  She is spitting frequently into emesis bag with clear phlegm.  Mucus membranes are dry.  DDx includes pregnancy, gastritis, hyperemesis cannabinol syndrome, appendicitis, less likely SBO.  CBC without leukocytosis, no anemia. Lipase is within normal range. CMP with very mild hypokalemia of 3.2, slightly elevated BUN/Creatinine 23/1.05 suspect dehydration as she has had decreased PO intake x 1 week. UA without infection. Urine pregnancy test is negative. IVF, zofran given. On reassessment abdominal exam is benign and tachycardia has resolved. She continues to have nausea and is  spitting into a bag. Do not feel imaging is needed as she has reassuring exam and vitals. Will give haldol, PO challenge and reassess.  Patient care transferred to B. Morelli PA-C at the end of my shift. Patient presentation, ED course, and plan of care discussed with review of all pertinent labs and imaging. Please see his note for further details regarding further ED course and disposition.   Portions of this note were generated with [employer]. Dictation errors may occur despite best attempts at proofreading.    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

## 2019-09-01 NOTE — Discharge Instructions (Addendum)
You have been seen today for nausea and vomiting. Please read and follow all provided instructions. Return to the emergency room for worsening condition or new concerning symptoms.    1. Medications:  Prescription sent to your pharmacy for zofran.  Continue usual home medications Take medications as prescribed. Please review all of the medicines and only take them if you do not have an allergy to them.   2. Treatment: rest, drink plenty of fluids  3. Follow Up:  Please follow up with primary care provider by scheduling an appointment as soon as possible for a visit  If you do not have a primary care physician, contact [facility] at [phone] for referral  4. Incidental Findings: Your urine test today showed protein, hemoglobin and ketones.  Your metabolic panel showed evidence of dehydration.  Please log into your MyChart account to review all of your lab tests today and discuss every lab test with your primary care provider at your follow-up visit this week.   It is also a possibility that you have an allergic reaction to any of the medicines that you have been prescribed - Everybody reacts differently to medications and while MOST people have no trouble with most medicines, you may have a reaction such as nausea, vomiting, rash, swelling, shortness of breath. If this is the case, please stop taking the medicine immediately and contact your physician.  ?  Get help right away if: You have pain in your chest, neck, arm, or jaw. You feel extremely weak or you faint. You have persistent vomiting. You have vomit that is bright red or looks like black coffee grounds. You have bloody or black stools or stools that look like tar. You have a severe headache, a stiff neck, or both. You have severe pain, cramping, or bloating in your abdomen. You have difficulty breathing, or you are breathing very quickly. Your heart is beating very quickly. Your skin feels cold and clammy. You feel  confused. You have signs of dehydration, such as: Dark urine, very little urine, or no urine. Cracked lips. Dry mouth. Sunken eyes. Sleepiness. Weakness. You have any new/concerning or worsening of symptoms

## 2019-11-17 ENCOUNTER — Emergency Department (HOSPITAL_COMMUNITY): Payer: Medicaid Other

## 2019-11-17 ENCOUNTER — Emergency Department (HOSPITAL_COMMUNITY)
Admission: EM | Admit: 2019-11-17 | Discharge: 2019-11-17 | Disposition: A | Discharge status: Home / Self Care | Payer: Medicaid Other | Attending: Emergency Medicine | Admitting: Emergency Medicine

## 2019-11-17 ENCOUNTER — Other Ambulatory Visit: Payer: Self-pay

## 2019-11-17 ENCOUNTER — Telehealth (HOSPITAL_COMMUNITY): Payer: Self-pay | Admitting: Emergency Medicine

## 2019-11-17 ENCOUNTER — Encounter (HOSPITAL_COMMUNITY): Payer: Self-pay | Admitting: *Deleted

## 2019-11-17 DIAGNOSIS — O0001 Abdominal pregnancy with intrauterine pregnancy: Secondary | ICD-10-CM | POA: Diagnosis not present

## 2019-11-17 DIAGNOSIS — O219 Vomiting of pregnancy, unspecified: Secondary | ICD-10-CM | POA: Diagnosis present

## 2019-11-17 DIAGNOSIS — R112 Nausea with vomiting, unspecified: Secondary | ICD-10-CM

## 2019-11-17 DIAGNOSIS — Z3A01 Less than 8 weeks gestation of pregnancy: Secondary | ICD-10-CM | POA: Diagnosis not present

## 2019-11-17 DIAGNOSIS — Z331 Pregnant state, incidental: Secondary | ICD-10-CM

## 2019-11-17 DIAGNOSIS — Z79899 Other long term (current) drug therapy: Secondary | ICD-10-CM | POA: Insufficient documentation

## 2019-11-17 DIAGNOSIS — R102 Pelvic and perineal pain: Secondary | ICD-10-CM

## 2019-11-17 LAB — URINALYSIS, ROUTINE W REFLEX MICROSCOPIC
Bilirubin Urine: NEGATIVE
Glucose, UA: 50 mg/dL — AB
Hgb urine dipstick: NEGATIVE
Ketones, ur: 80 mg/dL — AB
Leukocytes,Ua: NEGATIVE
Nitrite: NEGATIVE
Protein, ur: >=300 mg/dL — AB
Specific Gravity, Urine: 1.031 — ABNORMAL HIGH (ref 1.005–1.030)
pH: 5 (ref 5.0–8.0)

## 2019-11-17 LAB — COMPREHENSIVE METABOLIC PANEL
ALT: 13 U/L (ref 0–44)
AST: 16 U/L (ref 15–41)
Albumin: 5 g/dL (ref 3.5–5.0)
Alkaline Phosphatase: 38 U/L (ref 38–126)
Anion gap: 13 (ref 5–15)
BUN: 15 mg/dL (ref 6–20)
CO2: 19 mmol/L — ABNORMAL LOW (ref 22–32)
Calcium: 10.1 mg/dL (ref 8.9–10.3)
Chloride: 111 mmol/L (ref 98–111)
Creatinine, Ser: 0.85 mg/dL (ref 0.44–1.00)
GFR calc Af Amer: >60 mL/min (ref >60)
GFR calc non Af Amer: >60 mL/min (ref >60)
Glucose, Bld: 143 mg/dL — ABNORMAL HIGH (ref 70–99)
Potassium: 3.8 mmol/L (ref 3.5–5.1)
Sodium: 143 mmol/L (ref 135–145)
Total Bilirubin: 1.2 mg/dL (ref 0.3–1.2)
Total Protein: 9.5 g/dL — ABNORMAL HIGH (ref 6.5–8.1)

## 2019-11-17 LAB — CBC
HCT: 36.8 % (ref 36.0–46.0)
Hemoglobin: 13.3 g/dL (ref 12.0–15.0)
MCH: 27.9 pg (ref 26.0–34.0)
MCHC: 36.1 g/dL — ABNORMAL HIGH (ref 30.0–36.0)
MCV: 77.3 fL — ABNORMAL LOW (ref 80.0–100.0)
Platelets: 264 10*3/uL (ref 150–400)
RBC: 4.76 MIL/uL (ref 3.87–5.11)
RDW: 14.4 % (ref 11.5–15.5)
WBC: 16.7 10*3/uL — ABNORMAL HIGH (ref 4.0–10.5)
nRBC: 0 % (ref 0.0–0.2)

## 2019-11-17 LAB — LIPASE, BLOOD: Lipase: 27 U/L (ref 11–51)

## 2019-11-17 LAB — I-STAT BETA HCG BLOOD, ED (MC, WL, AP ONLY): I-stat hCG, quantitative: >2000 m[IU]/mL — ABNORMAL HIGH (ref <5)

## 2019-11-17 MED ORDER — DOXYLAMINE-PYRIDOXINE 10-10 MG PO TBEC: 1.0000 | DELAYED_RELEASE_TABLET | Freq: Every evening | ORAL | 1 refills | Status: DC | PRN

## 2019-11-17 MED ORDER — ONDANSETRON HCL 4 MG/2ML IJ SOLN
4.0000 mg | Freq: Once | INTRAMUSCULAR | Status: AC
  Administered 2019-11-17: 4 mg via INTRAVENOUS
  Filled 2019-11-17: qty 2

## 2019-11-17 MED ORDER — SODIUM CHLORIDE 0.9% FLUSH
3.0000 mL | Freq: Once | INTRAVENOUS | Status: AC
  Administered 2019-11-17: 3 mL via INTRAVENOUS

## 2019-11-17 MED ORDER — FAMOTIDINE IN NACL 20-0.9 MG/50ML-% IV SOLN
20.0000 mg | Freq: Once | INTRAVENOUS | Status: AC
  Administered 2019-11-17: 20 mg via INTRAVENOUS
  Filled 2019-11-17: qty 50

## 2019-11-17 MED ORDER — METOCLOPRAMIDE HCL 10 MG PO TABS: 15.0000 mg | ORAL_TABLET | Freq: Four times a day (QID) | ORAL | 0 refills | Status: DC | PRN

## 2019-11-17 MED ORDER — SODIUM CHLORIDE 0.9 % IV BOLUS
1000.0000 mL | Freq: Once | INTRAVENOUS | Status: AC
  Administered 2019-11-17: 1000 mL via INTRAVENOUS

## 2019-11-17 NOTE — ED Triage Notes (Signed)
Pt states she has been vomiting since Wednesday and unable to sleep. This also happened in Dec. Mother verbalized she does not feel like her daughter received fair treatment. Had to wait hours in lobby and did not ger problem resolved. Pt has not followed up with PCP or specialst

## 2019-11-17 NOTE — Progress Notes (Addendum)
TOC CM received call from pt's mother, [place]. Stating insurance would not cover Doxylamine-Pyridoxine. Message sent to ED provider to make aware. Will send over new Rx to assist with n/v. [facility]. Contacted pt's mother to make aware, new Rx sent. Isidoro Donning RN CCM, [facility] ED TOC CM [phone]

## 2019-11-17 NOTE — ED Provider Notes (Signed)
[facility]-EMERGENCY DEPT Provider Note   CSN: [id number] Arrival date & time: 11/17/19  6644     History Chief Complaint  Patient presents with  . Emesis  . Abdominal Pain    Cynthia [name] is a 29 y.o. female.  Patient is a 29 year old female who presents with nausea vomiting.  She states about 5 to 6 days ago she started having some nausea and was having difficulty keeping things down but it got worse about 2 days ago.  Over the last 2 days she has not been able to keep anything down.  She vomits anytime she tries to eat or drink.  Its mostly yellow/brown emesis, nonbloody.  She currently denies any abdominal pain.  She says sometimes before she vomits she has a pressure feeling in her upper abdomen up into her chest but denies any currently.  No change in bowels.  No urinary symptoms.  No known fevers.  She has had episodes like this twice before and has been treated in the ED, most recently this past December.  She has not yet had a chance to follow-up with a primary care physician.  She does have a primary care physician.  She has no prior abdominal surgeries other than a D&C.  She does admit to marijuana use but denies any other drug use.        Past Medical History:  Diagnosis Date  . Narcolepsy     Patient Active Problem List   Diagnosis Date Noted  . Nausea/vomiting in pregnancy 03/16/2018  . Dehydration 03/16/2018  . Hypokalemia 03/16/2018  . 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 DILATION AND CURETTAGE OF UTERUS       OB History    Gravida  7   Para  2   Term  2   Preterm      AB  4   Living  2     SAB      TAB  4   Ectopic      Multiple  0   Live Births  2           Family History  Problem Relation Age of Onset  . Hypertension Other     Social History   Tobacco Use  . Smoking status: Never Smoker  . Smokeless tobacco: Never  Used  Substance Use Topics  . Alcohol use: No    Comment: socially  . Drug use: No    Frequency: 7.0 times per week    Types: Marijuana    Home Medications Prior to Admission medications   Medication Sig Start Date End Date Taking? Authorizing Provider  amphetamine-dextroamphetamine (ADDERALL XR) 20 MG 24 hr capsule Take 1 capsule (20 mg total) by mouth daily. 08/02/19   Jetty Duhamel D, MD  Doxylamine-Pyridoxine (DICLEGIS) 10-10 MG TBEC Take 1 tablet by mouth at bedtime as needed. 11/17/19   Rolan Bucco, MD  glycopyrrolate (ROBINUL) 1 MG tablet Take 1 tablet (1 mg total) by mouth every 8 (eight) hours as needed. Patient not taking: Reported on 09/01/2019 03/16/18   Aviva Signs, CNM    Allergies    Patient has no known allergies.  Review of Systems   Review of Systems  Constitutional: Negative for chills, diaphoresis, fatigue and fever.  HENT: Negative for congestion, rhinorrhea and sneezing.   Eyes: Negative.   Respiratory: Negative for cough, chest tightness and shortness of breath.  Cardiovascular: Positive for chest pain. Negative for leg swelling.  Gastrointestinal: Positive for abdominal pain, nausea and vomiting. Negative for blood in stool and diarrhea.  Genitourinary: Negative for difficulty urinating, flank pain, frequency and hematuria.  Musculoskeletal: Negative for arthralgias and back pain.  Skin: Negative for rash.  Neurological: Negative for dizziness, speech difficulty, weakness, numbness and headaches.    Physical Exam Updated Vital Signs BP 120/63   Pulse 79   Temp 97.6 F (36.4 C) (Oral)   Resp 16   Ht 4\' 11"  (1.499 m)   Wt 59.9 kg   SpO2 99%   BMI 26.66 kg/m   Physical Exam Constitutional:      Appearance: She is well-developed.  HENT:     Head: Normocephalic and atraumatic.     Mouth/Throat:     Mouth: Mucous membranes are dry.  Eyes:     Pupils: Pupils are equal, round, and reactive to light.  Cardiovascular:     Rate and  Rhythm: Normal rate and regular rhythm.     Heart sounds: Normal heart sounds.  Pulmonary:     Effort: Pulmonary effort is normal. No respiratory distress.     Breath sounds: Normal breath sounds. No wheezing or rales.  Chest:     Chest wall: No tenderness.  Abdominal:     General: Bowel sounds are normal.     Palpations: Abdomen is soft.     Tenderness: There is no abdominal tenderness. There is no guarding or rebound.  Musculoskeletal:        General: Normal range of motion.     Cervical back: Normal range of motion and neck supple.  Lymphadenopathy:     Cervical: No cervical adenopathy.  Skin:    General: Skin is warm and dry.     Findings: No rash.  Neurological:     Mental Status: She is alert and oriented to person, place, and time.     ED Results / Procedures / Treatments   Labs (all labs ordered are listed, but only abnormal results are displayed) Labs Reviewed  COMPREHENSIVE METABOLIC PANEL - Abnormal; Notable for the following components:      Result Value   CO2 19 (*)    Glucose, Bld 143 (*)    Total Protein 9.5 (*)    All other components within normal limits  CBC - Abnormal; Notable for the following components:   WBC 16.7 (*)    MCV 77.3 (*)    MCHC 36.1 (*)    All other components within normal limits  URINALYSIS, ROUTINE W REFLEX MICROSCOPIC - Abnormal; Notable for the following components:   APPearance CLOUDY (*)    Specific Gravity, Urine 1.031 (*)    Glucose, UA 50 (*)    Ketones, ur 80 (*)    Protein, ur >=300 (*)    Bacteria, UA RARE (*)    All other components within normal limits  I-STAT BETA HCG BLOOD, ED ([facility], [facility], [facility] ONLY) - Abnormal; Notable for the following components:   I-stat hCG, quantitative >2,000.0 (*)    All other components within normal limits  LIPASE, BLOOD    EKG None  Radiology US OB LESS THAN 14 WEEKS WITH OB TRANSVAGINAL  Result Date: 11/17/2019 CLINICAL DATA:  Pelvic pain. Positive pregnancy test. First trimester.  EXAM: OBSTETRIC <14 WK [place] AND TRANSVAGINAL OB US TECHNIQUE: Both transabdominal and transvaginal ultrasound examinations were performed for complete evaluation of the gestation as well as the maternal uterus, adnexal regions, and pelvic  cul-de-sac. Transvaginal technique was performed to assess early pregnancy. COMPARISON:  None. FINDINGS: Intrauterine gestational sac: Present Yolk sac:  Present Embryo:  Present Cardiac Activity: Present Heart Rate: 120 bpm CRL: 4.9 mm mm   6 w   2 d                  [place] EDC: /20/21 Subchorionic hemorrhage:  None visualized. Maternal uterus/adnexae: Otherwise unremarkable. Corpus luteal cyst is evident in the right ovary. Minimal fluid is present adjacent to the right ovary. IMPRESSION: Single intrauterine pregnancy estimated gestational age of [redacted] weeks and 2 days. No complicating features. Electronically Signed   By: Marin Roberts M.D.   On: 11/17/2019 10:33    Procedures Procedures (including critical care time)  Medications Ordered in ED Medications  sodium chloride flush (NS) 0.9 % injection 3 mL (3 mLs Intravenous Given 11/17/19 0739)  sodium chloride 0.9 % bolus 1,000 mL (0 mLs Intravenous Stopped 11/17/19 1144)  ondansetron (ZOFRAN) injection 4 mg (4 mg Intravenous Given 11/17/19 0740)  famotidine (PEPCID) IVPB 20 mg premix (0 mg Intravenous Stopped 11/17/19 1144)    ED Course  I have reviewed the triage vital signs and the nursing notes.  Pertinent labs & imaging results that were available during my care of the patient were reviewed by me and considered in my medical decision making (see chart for details).    MDM Rules/Calculators/A&P                      Patient is a 29 year old female who presents with nausea and vomiting.  She has no associate abdominal pain on abdominal exam.  Her labs are nonconcerning.  Her glucose was mildly elevated which can be rechecked by her OB/GYN in follow-up.  Her pregnancy test is positive and a pelvic ultrasound was  done which showed an intrauterine pregnancy at [redacted] weeks gestation.  She was treated symptomatically in the ED and her symptoms have improved.  She is able to tolerate oral fluids without difficulty.  I did counsel her on starting a prenatal vitamin and following up with an OB/GYN.  She was counseled on marijuana cessation which could be contributing to her vomiting.  Return precautions were given.  She was given a prescription for Diclegis. Final Clinical Impression(s) / ED Diagnoses Final diagnoses:  IUP (intrauterine pregnancy), incidental  Non-intractable vomiting with nausea, unspecified vomiting type    Rx / DC Orders ED Discharge Orders         Ordered    Doxylamine-Pyridoxine (DICLEGIS) 10-10 MG TBEC  At bedtime PRN     11/17/19 1253           Rolan Bucco, MD 11/17/19 1255

## 2019-11-17 NOTE — ED Notes (Signed)
US at bedside

## 2019-11-27 ENCOUNTER — Telehealth: Payer: Self-pay | Admitting: Internal Medicine

## 2019-11-27 NOTE — Telephone Encounter (Signed)
Per last phone note dated 08/02/19- Young, Clinton D, MD to [facility] Triage Pool     11:26 AM Note   I will send in a refill for her Adderall, but she must make a f/u with one of our sleep doctors before any further refills after this one.       I advised the pt again of the above recs per Dr Maple Hudson  She is scheduled with Dr Wynona Neat on 12/07/19 at 10:30 am  Nothing further needed

## 2019-12-07 ENCOUNTER — Institutional Professional Consult (permissible substitution): Payer: Medicaid Other | Admitting: Pulmonary Disease

## 2020-03-21 ENCOUNTER — Telehealth: Payer: Self-pay | Admitting: Internal Medicine

## 2020-03-21 NOTE — Telephone Encounter (Signed)
ATC Patient.  LMTCB.  Per Dr Roxy Cedar last encounter 11/27/19-  I will send in a refill for her Adderall, but she must make a f/u with one of our sleep doctors before any further refills after this one.  Patient was scheduled 12/07/19 with Dr Wynona Neat, but was a no show.

## 2020-03-22 ENCOUNTER — Other Ambulatory Visit: Payer: Self-pay | Admitting: *Deleted

## 2020-03-22 MED ORDER — AMPHETAMINE-DEXTROAMPHET ER 20 MG PO CP24: 20.0000 mg | ORAL_CAPSULE | Freq: Every day | ORAL | 0 refills | Status: DC

## 2020-03-22 NOTE — Telephone Encounter (Signed)
Patient is returning phone call. Informed patient refill for Adderall will be sent into pharmacy. Patient would like a call back to confirm called in. Patient states started new job. Will call back to schedule appointment with new sleep Doctor. Patient phone number is [phone].

## 2020-03-22 NOTE — Telephone Encounter (Signed)
Spoke with patient and provided information per Dr. Maple Hudson.  Patient states she just started a new job and works 9-6 and she needs an 8 am appointment. I let her know I would make a note in her chart, but that she will have to be seen by Dr. Wynona Neat prior to receiving any further refills.  She verbalized understanding.  Nothing further needed.

## 2020-03-22 NOTE — Telephone Encounter (Signed)
I have refilled Adderall this time to [facility] [address] Please tell her NO MORE refills until she has kept an appointment with Dr Wynona Neat to establish with him.

## 2020-03-22 NOTE — Telephone Encounter (Signed)
Refill request for Adderall 20 mg  Last OV: 02/24/2018 Next OV: Is supposed to set up with sleep Dr. No showed on 12/07/19 with Dr. Wynona Neat  Last ordered by : Dr. Maple Hudson on 08/02/2019 Quantity: 30 Instructions: Take 1 capsule (20 mg total) by mouth daily  Dr. Maple Hudson please advise  Patient is returning phone call. Informed patient refill for Adderall will be sent into pharmacy. Patient would like a call back to confirm called in. Patient states started new job. Will call back to schedule appointment with new sleep Doctor. Patient phone number is [phone].     No Known Allergies  Current Outpatient Medications on File Prior to Visit  Medication Sig Dispense Refill  . amphetamine-dextroamphetamine (ADDERALL XR) 20 MG 24 hr capsule Take 1 capsule (20 mg total) by mouth daily. 30 capsule 0  . Doxylamine-Pyridoxine (DICLEGIS) 10-10 MG TBEC Take 1 tablet by mouth at bedtime as needed. 15 tablet 1  . glycopyrrolate (ROBINUL) 1 MG tablet Take 1 tablet (1 mg total) by mouth every 8 (eight) hours as needed. (Patient not taking: Reported on 09/01/2019) 30 tablet 0  . metoCLOPramide (REGLAN) 10 MG tablet Take 1.5 tablets (15 mg total) by mouth every 6 (six) hours as needed for nausea (nausea/headache). 6 tablet 0   No current facility-administered medications on file prior to visit.

## 2020-10-17 ENCOUNTER — Other Ambulatory Visit: Payer: Self-pay

## 2020-10-17 ENCOUNTER — Encounter: Payer: Self-pay | Admitting: Adult Health

## 2020-10-17 ENCOUNTER — Ambulatory Visit (INDEPENDENT_AMBULATORY_CARE_PROVIDER_SITE_OTHER): Payer: Medicaid Other | Admitting: Adult Health

## 2020-10-17 ENCOUNTER — Telehealth: Payer: Self-pay | Admitting: Adult Health

## 2020-10-17 VITALS — BP 106/60 | HR 70 | Temp 97.3°F | Ht 59.0 in | Wt 141.0 lb

## 2020-10-17 DIAGNOSIS — G47419 Narcolepsy without cataplexy: Secondary | ICD-10-CM

## 2020-10-17 NOTE — Telephone Encounter (Signed)
10/17/2020  Called and spoke with Cynthia [name].  Cynthia [name] reports that she was seen today by TP NP.  Current plan is to have nuvigil prescribed if pregnancy test is negative.  Pregnancy test is currently pending.  Cynthia [name] established with Dr. Maple Hudson.  Cynthia [name] would like to discuss this further with TP NP.  Cynthia [name] specific questions are regarding the side effects of Nuvigil such as headache and nausea as Cynthia [name] is concerned that if she has any symptoms she would rather remain on her current medications versus switching.  Explained to Cynthia [name] that we will get this message sent over to TP NP.  Also explained to Cynthia [name] that TP NP may defer to Dr. Roxy Cedar as to the Cynthia [name] is established withone of the sleep MDs of our office.  If that is the case then we would also get the message in question sent over to him.  TP please advise  Elisha Headland, FNP

## 2020-10-17 NOTE — Telephone Encounter (Signed)
Discussed medication with patient.  Questions answered.  Pending patient's pregnancy test will prescribe medication if patient is unable to tolerate she will call us back.

## 2020-10-17 NOTE — Progress Notes (Signed)
@Patient  ID: , female    DOB: [birth year], 30 y.o.   MRN: [id number]  Chief Complaint  Patient presents with  . Follow-up    Referring provider: No ref. provider found  HPI: 30 year old female never smoker followed for narcolepsy with hypersomnia  TEST/EVENTS :  NPSG- 10/09/09- AHI 0.2 per hour, RDI 20.4 per hour intermittent moderate snoring- MSLT 07/06/11- mean latency 2.1 minutes with SOREM  5  10/17/2020 Follow up : Narcolepsy with hypersomnia Patient presents for a follow-up visit.  Last seen June 2019.  Patient has underlying narcolepsy with hypersomnia.  Previously has been on Adderall XR 20 mg daily.  She says she rarely takes this and has not had it for some time.  She feels that it causes problems with her appetite and also with chapped lips.  She wants to try different medication.  She has ongoing daytime sleepiness.  She wants something to help with this.  Patient says she feels sleepy anytime she gets feel or rest.  During the daytime. No evidence of seizure activity or cataplexy.  Patient is not on birth control.  Denies known pregnancy.  No Known Allergies  Immunization History  Administered Date(s) Administered  . Influenza Split 06/21/2014    Past Medical History:  Diagnosis Date  . Narcolepsy     Tobacco History: Social History   Tobacco Use  Smoking Status Never Smoker  Smokeless Tobacco Never Used   Counseling given: Not Answered   Outpatient Medications Prior to Visit  Medication Sig Dispense Refill  . amphetamine-dextroamphetamine (ADDERALL XR) 20 MG 24 hr capsule Take 1 capsule (20 mg total) by mouth daily. 30 capsule 0  . Doxylamine-Pyridoxine (DICLEGIS) 10-10 MG TBEC Take 1 tablet by mouth at bedtime as needed. (Patient not taking: Reported on 10/17/2020) 15 tablet 1  . glycopyrrolate (ROBINUL) 1 MG tablet Take 1 tablet (1 mg total) by mouth every 8 (eight) hours as needed. (Patient not taking: No sig reported) 30 tablet 0  .  metoCLOPramide (REGLAN) 10 MG tablet Take 1.5 tablets (15 mg total) by mouth every 6 (six) hours as needed for nausea (nausea/headache). (Patient not taking: Reported on 10/17/2020) 6 tablet 0   No facility-administered medications prior to visit.     Review of Systems:   Constitutional:   No  weight loss, night sweats,  Fevers, chills,  +fatigue, or  lassitude.  HEENT:   No headaches,  Difficulty swallowing,  Tooth/dental problems, or  Sore throat,                No sneezing, itching, ear ache, nasal congestion, post nasal drip,   CV:  No chest pain,  Orthopnea, PND, swelling in lower extremities, anasarca, dizziness, palpitations, syncope.   GI  No heartburn, indigestion, abdominal pain, nausea, vomiting, diarrhea, change in bowel habits, loss of appetite, bloody stools.   Resp: No shortness of breath with exertion or at rest.  No excess mucus, no productive cough,  No non-productive cough,  No coughing up of blood.  No change in color of mucus.  No wheezing.  No chest wall deformity  Skin: no rash or lesions.  GU: no dysuria, change in color of urine, no urgency or frequency.  No flank pain, no hematuria   MS:  No joint pain or swelling.  No decreased range of motion.  No back pain.    Physical Exam  BP 106/60 (BP Location: Left Arm, Patient Position: Sitting, Cuff Size: Normal)   Pulse 70  Temp (!) 97.3 F (36.3 C) (Temporal)   Ht 4\' 11"  (1.499 m)   Wt 141 lb (64 kg)   SpO2 100%   BMI 28.48 kg/m   GEN: A/Ox3; pleasant , NAD, well nourished    HEENT:  [place]/AT,    NOSE-clear, THROAT-clear, no lesions, no postnasal drip or exudate noted.   NECK:  Supple w/ fair ROM; no JVD; normal carotid impulses w/o bruits; no thyromegaly or nodules palpated; no lymphadenopathy.    RESP  Clear  P & A; w/o, wheezes/ rales/ or rhonchi. no accessory muscle use, no dullness to percussion  CARD:  RRR, no m/r/g, no peripheral edema, pulses intact, no cyanosis or clubbing.  GI:   Soft &  nt; nml bowel sounds; no organomegaly or masses detected.   Musco: Warm bil, no deformities or joint swelling noted.   Neuro: alert, no focal deficits noted.    Skin: Warm, no lesions or rashes    Lab Results:  CBC   BMET  BNP No results found for: BNP  ProBNP No results found for: PROBNP  Imaging: No results found.    No flowsheet data found.  No results found for: NITRICOXIDE      Assessment & Plan:   No problem-specific Assessment & Plan notes found for this encounter.     , NP 10/17/2020

## 2020-10-17 NOTE — Patient Instructions (Signed)
Begin Nuvigil 150mg  daily .  Pregnancy test today , once results return will send in prescription.  Nap when you can.  Make sure to get enough sleep at night Follow up with Dr.  In 2-3 months and As needed

## 2020-10-18 LAB — HCG, SERUM, QUALITATIVE: Preg, Serum: NEGATIVE

## 2020-10-18 MED ORDER — ARMODAFINIL 150 MG PO TABS: 150.0000 mg | ORAL_TABLET | Freq: Every day | ORAL | 0 refills | Status: DC

## 2020-10-21 NOTE — Assessment & Plan Note (Signed)
Narcolepsy with hypersomnia.  Patient would like to try an alternative to Adderall.  Endorsing side effects to Adderall. We will try  Nuvigil  Patient education given. Pregnancy test will be obtained today.  Patient is advised of pregnancy contraindication. Once pregnancy test is returned we will send a prescription to her pharmacy.  Plan  Patient Instructions  Begin Nuvigil 150mg  daily .  Pregnancy test today , once results return will send in prescription.  Nap when you can.  Make sure to get enough sleep at night Follow up with Dr.  In 2-3 months and As needed

## 2020-10-22 ENCOUNTER — Telehealth: Payer: Self-pay | Admitting: Adult Health

## 2020-10-22 NOTE — Telephone Encounter (Signed)
Called pt. She states the [place] needs a PA. [facility] and was advised med does [facility] need [facility] PA and to call [facility] [phone]. Called [place] and spoke to Omar. PA has been initiated and will take about 24 hours. PA#: [id number]. Will be notified of decision via fax. Will follow back up.

## 2020-10-22 NOTE — Telephone Encounter (Signed)
lmtcb for pt.  

## 2020-10-25 NOTE — Telephone Encounter (Signed)
Triage, please advise if there has been a fax with this decision. Can also be with TP's stuff potentially.

## 2020-10-25 NOTE — Telephone Encounter (Signed)
Checked PA folder and TP's inbox up front. Did no see anything for this patient.

## 2020-10-28 NOTE — Telephone Encounter (Signed)
Called [facility]. Per [facility] records, No PA was found for this member. I called and spoke with her to let her know that I will do a PA online and mark it as urgent since she has been waiting for over a week. She verbalized understanding.   PA was started on [email or web address]. Key is [id number]. Sent as an urgent request.   Will check back later on PA.

## 2020-10-28 NOTE — Telephone Encounter (Signed)
PA has been approved for 30 tablets per 30 days. Pharmacy is aware of the approval. Patient is also aware of the approval. Nothing further needed.
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
## 2020-12-02 ENCOUNTER — Ambulatory Visit: Payer: Medicaid Other | Admitting: Internal Medicine

## 2021-01-20 ENCOUNTER — Emergency Department (HOSPITAL_COMMUNITY)
Admission: EM | Admit: 2021-01-20 | Discharge: 2021-01-21 | Disposition: A | Discharge status: Home / Self Care | Payer: Medicaid Other | Attending: Emergency Medicine | Admitting: Emergency Medicine

## 2021-01-20 ENCOUNTER — Emergency Department (HOSPITAL_COMMUNITY): Payer: Medicaid Other

## 2021-01-20 DIAGNOSIS — O219 Vomiting of pregnancy, unspecified: Secondary | ICD-10-CM | POA: Insufficient documentation

## 2021-01-20 DIAGNOSIS — Z3A01 Less than 8 weeks gestation of pregnancy: Secondary | ICD-10-CM | POA: Diagnosis not present

## 2021-01-20 DIAGNOSIS — Z3491 Encounter for supervision of normal pregnancy, unspecified, first trimester: Secondary | ICD-10-CM

## 2021-01-20 LAB — COMPREHENSIVE METABOLIC PANEL
ALT: 17 U/L (ref 0–44)
AST: 17 U/L (ref 15–41)
Albumin: 4.8 g/dL (ref 3.5–5.0)
Alkaline Phosphatase: 43 U/L (ref 38–126)
Anion gap: 16 — ABNORMAL HIGH (ref 5–15)
BUN: 17 mg/dL (ref 6–20)
CO2: 19 mmol/L — ABNORMAL LOW (ref 22–32)
Calcium: 10.3 mg/dL (ref 8.9–10.3)
Chloride: 102 mmol/L (ref 98–111)
Creatinine, Ser: 0.88 mg/dL (ref 0.44–1.00)
GFR, Estimated: >60 mL/min (ref >60)
Glucose, Bld: 101 mg/dL — ABNORMAL HIGH (ref 70–99)
Potassium: 2.8 mmol/L — ABNORMAL LOW (ref 3.5–5.1)
Sodium: 137 mmol/L (ref 135–145)
Total Bilirubin: 1.7 mg/dL — ABNORMAL HIGH (ref 0.3–1.2)
Total Protein: 9 g/dL — ABNORMAL HIGH (ref 6.5–8.1)

## 2021-01-20 LAB — CBC WITH DIFFERENTIAL/PLATELET
Abs Immature Granulocytes: 0.12 10*3/uL — ABNORMAL HIGH (ref 0.00–0.07)
Basophils Absolute: 0 10*3/uL (ref 0.0–0.1)
Basophils Relative: 0 %
Eosinophils Absolute: 0 10*3/uL (ref 0.0–0.5)
Eosinophils Relative: 0 %
HCT: 38.7 % (ref 36.0–46.0)
Hemoglobin: 14.2 g/dL (ref 12.0–15.0)
Immature Granulocytes: 1 %
Lymphocytes Relative: 9 %
Lymphs Abs: 2 10*3/uL (ref 0.7–4.0)
MCH: 28.1 pg (ref 26.0–34.0)
MCHC: 36.7 g/dL — ABNORMAL HIGH (ref 30.0–36.0)
MCV: 76.5 fL — ABNORMAL LOW (ref 80.0–100.0)
Monocytes Absolute: 2.3 10*3/uL — ABNORMAL HIGH (ref 0.1–1.0)
Monocytes Relative: 11 %
Neutro Abs: 16.8 10*3/uL — ABNORMAL HIGH (ref 1.7–7.7)
Neutrophils Relative %: 79 %
Platelets: 254 10*3/uL (ref 150–400)
RBC: 5.06 MIL/uL (ref 3.87–5.11)
RDW: 14.2 % (ref 11.5–15.5)
WBC: 21.2 10*3/uL — ABNORMAL HIGH (ref 4.0–10.5)
nRBC: 0 % (ref 0.0–0.2)

## 2021-01-20 LAB — LIPASE, BLOOD: Lipase: 37 U/L (ref 11–51)

## 2021-01-20 LAB — HCG, QUANTITATIVE, PREGNANCY: hCG, Beta Chain, Quant, S: 202730 m[IU]/mL — ABNORMAL HIGH (ref <5)

## 2021-01-20 LAB — I-STAT BETA HCG BLOOD, ED (MC, WL, AP ONLY): I-stat hCG, quantitative: >2000 m[IU]/mL — ABNORMAL HIGH (ref <5)

## 2021-01-20 MED ORDER — ONDANSETRON HCL 4 MG/2ML IJ SOLN
4.0000 mg | Freq: Once | INTRAMUSCULAR | Status: AC
  Administered 2021-01-20: 4 mg via INTRAVENOUS
  Filled 2021-01-20: qty 2

## 2021-01-20 MED ORDER — LACTATED RINGERS IV BOLUS
1000.0000 mL | Freq: Once | INTRAVENOUS | Status: AC
  Administered 2021-01-20: 1000 mL via INTRAVENOUS

## 2021-01-20 MED ORDER — ONDANSETRON 4 MG PO TBDP: 4.0000 mg | ORAL_TABLET | Freq: Once | ORAL | Status: DC

## 2021-01-20 NOTE — ED Triage Notes (Signed)
Emergency Medicine Provider Triage Evaluation Note  Abisola P Lemma , a 30 y.o. female  was evaluated in triage.  Pt complains of V/N, spitting up brown stuff x 1-2 weeks, LMP 12/03/20, believes she may be pregnant and this is causing her to vomit up the lining of her stomach. Denies changes in bowel or bladder habits. No home pregnancy test. Social drinker, not lately.  Review of Systems  Positive: N/v Negative: Changes in bowel or bladder habits, pain  Physical Exam  BP (!) 166/115 (BP Location: Left Arm)   Pulse 94   Temp 97.7 F (36.5 C) (Oral)   Resp (!) 22   LMP 12/03/2020   SpO2 99%  Gen:   Awake, no distress   HEENT:  Atraumatic  Resp:  Normal effort  Cardiac:  Normal rate  Abd:   Nondistended, nontender  MSK:   Moves extremities without difficulty  Neuro:  Speech clear   Medical Decision Making  Medically screening exam initiated at 7:57 PM.  Appropriate orders placed.  Alwilda P Diel was informed that the remainder of the evaluation will be completed by another provider, this initial triage assessment does not replace that evaluation, and the importance of remaining in the ED until their evaluation is complete.  Clinical Impression     Alden Hipp 01/20/21 1958

## 2021-01-20 NOTE — ED Notes (Signed)
Ultrasound at bedside

## 2021-01-20 NOTE — ED Provider Notes (Signed)
[facility]-EMERGENCY DEPT Provider Note   CSN: [id number] Arrival date & time: 01/20/21  1845     History Chief Complaint  Patient presents with  . Emesis  . Generalized Body Aches    Cynthia [name] is a 30 y.o. female.  The history is provided by the patient and medical records.  Emesis   30 y.o. F here with nausea/vomiting.  States she thinks she is pregnant.  Last period was sometime in march, nothing at all in April.  She reports a lot of nausea/vomiting during prior pregnancies.  She is unsure exactly how many times she has been pregnant, but states these symptoms are why "only 2 of them came to life".  She states she has been trying to eat ice and drink water at home but not able to hold it down.  She denies any vaginal bleeding or discharge.  She is not currently established with OB-GYN.  Past Medical History:  Diagnosis Date  . Narcolepsy     Patient Active Problem List   Diagnosis Date Noted  . Nausea/vomiting in pregnancy 03/16/2018  . Dehydration 03/16/2018  . Hypokalemia 03/16/2018  . 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 DILATION AND CURETTAGE OF UTERUS       OB History    Gravida  7   Para  2   Term  2   Preterm      AB  4   Living  2     SAB      IAB  4   Ectopic      Multiple  0   Live Births  2           Family History  Problem Relation Age of Onset  . Hypertension Other     Social History   Tobacco Use  . Smoking status: Never Smoker  . Smokeless tobacco: Never Used  Substance Use Topics  . Alcohol use: No    Comment: socially  . Drug use: No    Frequency: 7.0 times per week    Types: Marijuana    Home Medications Prior to Admission medications   Medication Sig Start Date End Date Taking? Authorizing Provider  Armodafinil (NUVIGIL) 150 MG tablet Take 1 tablet (150 mg total) by mouth daily. 10/18/20   Parrett,  Virgel Bouquet, NP    Allergies    Patient has no known allergies.  Review of Systems   Review of Systems  Gastrointestinal: Positive for nausea and vomiting.  All other systems reviewed and are negative.   Physical Exam Updated Vital Signs BP (!) 152/98 (BP Location: Left Arm)   Pulse (!) 102   Temp 97.7 F (36.5 C) (Oral)   Resp 16   LMP 12/03/2020   SpO2 99%   Physical Exam Vitals and nursing note reviewed.  Constitutional:      Appearance: She is well-developed.     Comments: Flailing around in bed, spitting into bag  HENT:     Head: Normocephalic and atraumatic.  Eyes:     Conjunctiva/sclera: Conjunctivae normal.     Pupils: Pupils are equal, round, and reactive to light.  Cardiovascular:     Rate and Rhythm: Normal rate and regular rhythm.     Heart sounds: Normal heart sounds.  Pulmonary:     Effort: Pulmonary effort is normal.     Breath sounds: Normal breath sounds.  Abdominal:  General: Bowel sounds are normal.     Palpations: Abdomen is soft.     Tenderness: There is no abdominal tenderness. There is no rebound.  Musculoskeletal:        General: Normal range of motion.     Cervical back: Normal range of motion.  Skin:    General: Skin is warm and dry.  Neurological:     Mental Status: She is alert and oriented to person, place, and time.     ED Results / Procedures / Treatments   Labs (all labs ordered are listed, but only abnormal results are displayed) Labs Reviewed  CBC WITH DIFFERENTIAL/PLATELET - Abnormal; Notable for the following components:      Result Value   WBC 21.2 (*)    MCV 76.5 (*)    MCHC 36.7 (*)    Neutro Abs 16.8 (*)    Monocytes Absolute 2.3 (*)    Abs Immature Granulocytes 0.12 (*)    All other components within normal limits  COMPREHENSIVE METABOLIC PANEL - Abnormal; Notable for the following components:   Potassium 2.8 (*)    CO2 19 (*)    Glucose, Bld 101 (*)    Total Protein 9.0 (*)    Total Bilirubin 1.7 (*)     Anion gap 16 (*)    All other components within normal limits  URINALYSIS, ROUTINE W REFLEX MICROSCOPIC - Abnormal; Notable for the following components:   Specific Gravity, Urine 1.034 (*)    Glucose, UA 50 (*)    Ketones, ur 80 (*)    Protein, ur >=300 (*)    Bacteria, UA RARE (*)    All other components within normal limits  HCG, QUANTITATIVE, PREGNANCY - Abnormal; Notable for the following components:   hCG, Beta Chain, Quant, S 202,730 (*)    All other components within normal limits  I-STAT BETA HCG BLOOD, ED ([facility], [facility], [facility] ONLY) - Abnormal; Notable for the following components:   I-stat hCG, quantitative >2,000.0 (*)    All other components within normal limits  LIPASE, BLOOD    EKG None  Radiology US OB Comp Less 14 Wks  Result Date: 01/20/2021 CLINICAL DATA:  Pregnant, beta HCG greater than 2000 EXAM: OBSTETRIC <14 WK [place] AND TRANSVAGINAL OB US TECHNIQUE: Both transabdominal and transvaginal ultrasound examinations were performed for complete evaluation of the gestation as well as the maternal uterus, adnexal regions, and pelvic cul-de-sac. Transvaginal technique was performed to assess early pregnancy. COMPARISON:  None. FINDINGS: Intrauterine gestational sac: Single Yolk sac:  Visualized. Embryo:  Visualized. Cardiac Activity: Visualized. Heart Rate: 131 bpm CRL: 8.5 mm   6 w   6 d                  [place] EDC: 09/09/2021 Subchorionic hemorrhage: Trace subchorionic hemorrhage along the inferior margin of the gestational sac. Maternal uterus/adnexae: 2.9 cm corpus luteum cyst right ovary. Trace free fluid in the cul-de-sac. IMPRESSION: 1. Single live intrauterine pregnancy as above estimated age 63 weeks and 6 days. 2. Trace subchorionic hemorrhage. 3. Right ovarian corpus luteum cyst. Electronically Signed   By: Sharlet Salina M.D.   On: 01/20/2021 23:28   US OB Transvaginal  Result Date: 01/20/2021 CLINICAL DATA:  Pregnant, beta HCG greater than 2000 EXAM: OBSTETRIC <14 WK [place] AND  TRANSVAGINAL OB US TECHNIQUE: Both transabdominal and transvaginal ultrasound examinations were performed for complete evaluation of the gestation as well as the maternal uterus, adnexal regions, and pelvic cul-de-sac. Transvaginal technique was  performed to assess early pregnancy. COMPARISON:  None. FINDINGS: Intrauterine gestational sac: Single Yolk sac:  Visualized. Embryo:  Visualized. Cardiac Activity: Visualized. Heart Rate: 131 bpm CRL: 8.5 mm   6 w   6 d                  [place] EDC: 09/09/2021 Subchorionic hemorrhage: Trace subchorionic hemorrhage along the inferior margin of the gestational sac. Maternal uterus/adnexae: 2.9 cm corpus luteum cyst right ovary. Trace free fluid in the cul-de-sac. IMPRESSION: 1. Single live intrauterine pregnancy as above estimated age 51 weeks and 6 days. 2. Trace subchorionic hemorrhage. 3. Right ovarian corpus luteum cyst. Electronically Signed   By: Sharlet Salina M.D.   On: 01/20/2021 23:28    Procedures Procedures   Medications Ordered in ED Medications  lactated ringers bolus 1,000 mL (0 mLs Intravenous Stopped 01/21/21 0131)  ondansetron (ZOFRAN) injection 4 mg (4 mg Intravenous Given 01/20/21 2246)  potassium chloride SA (KLOR-CON) CR tablet 40 mEq (40 mEq Oral Given 01/21/21 0146)    ED Course  I have reviewed the triage vital signs and the nursing notes.  Pertinent labs & imaging results that were available during my care of the patient were reviewed by me and considered in my medical decision making (see chart for details).    MDM Rules/Calculators/A&P  30 year old female presenting to the ED with nausea and vomiting.  Thinks she may be pregnant.  She did not have a period in April.  States she has had similar symptoms with prior pregnancies.  She is quite dramatic on exam, flailing around in the bed and will barely sit still for an exam.  She denies any vaginal bleeding or discharge.  She is spitting into a bag but is not actively vomiting.  Her i-STAT  hCG is positive.  Does have leukocytosis which I suspect is reactive from her vomiting.  She does not have any fever or other infectious symptoms.  Her potassium is somewhat low, likely GI loss.  We will initiate fluids and antiemetics, then replete potassium.  Will obtain hCG, UA, and OB ultrasound.  1:32 AM [place] with [redacted]w[redacted]d fetus.  Does have subchorionic hemorrhage but remains without bleeding here.  UA with ketones and protein but no overt infection.  BP is controlled at [employer].  Patient is feeling better after IV fluids and medication here.  She has tolerated several cups of juice without recurrent vomiting.  Given PO K+.  We have discussed her US findings.  She is not sure if she will continue forward with this pregnancy or not.  Will start her on prenatals and diclegis in the interim.  Given OB-GYN follow-up.  Asked to return to [facility] at [facility] if pregnancy related complications.    Final Clinical Impression(s) / ED Diagnoses Final diagnoses:  Nausea and vomiting in pregnancy  First trimester pregnancy    Rx / DC Orders ED Discharge Orders         Ordered    Doxylamine-Pyridoxine (DICLEGIS) 10-10 MG TBEC  Daily at bedtime        01/21/21 0151    Prenatal Vit-Fe Fumarate-FA (PRENATAL COMPLETE) 14-0.4 MG TABS  Daily        01/21/21 0151           Garlon Hatchet, PA-C 01/21/21 0155    Arby Barrette, MD 01/21/21 1031

## 2021-01-20 NOTE — ED Triage Notes (Signed)
Per EMS- patient c/o N/V and body aches x 6 days. Patient states no period since 12/03/20 and states that these are the same symptoms she had when she was pregnant

## 2021-01-21 LAB — URINALYSIS, ROUTINE W REFLEX MICROSCOPIC
Bilirubin Urine: NEGATIVE
Glucose, UA: 50 mg/dL — AB
Hgb urine dipstick: NEGATIVE
Ketones, ur: 80 mg/dL — AB
Leukocytes,Ua: NEGATIVE
Nitrite: NEGATIVE
Protein, ur: >=300 mg/dL — AB
Specific Gravity, Urine: 1.034 — ABNORMAL HIGH (ref 1.005–1.030)
pH: 6 (ref 5.0–8.0)

## 2021-01-21 MED ORDER — PRENATAL COMPLETE 14-0.4 MG PO TABS: 1.0000 | ORAL_TABLET | Freq: Every day | ORAL | 0 refills | Status: DC

## 2021-01-21 MED ORDER — DOXYLAMINE-PYRIDOXINE 10-10 MG PO TBEC: 1.0000 | DELAYED_RELEASE_TABLET | Freq: Every day | ORAL | 0 refills | Status: DC

## 2021-01-21 MED ORDER — DOXYLAMINE-PYRIDOXINE 10-10 MG PO TBEC: 1.0000 | DELAYED_RELEASE_TABLET | Freq: Every evening | ORAL | 0 refills | Status: DC

## 2021-01-21 MED ORDER — POTASSIUM CHLORIDE CRYS ER 20 MEQ PO TBCR
40.0000 meq | EXTENDED_RELEASE_TABLET | Freq: Once | ORAL | Status: AC
  Administered 2021-01-21: 40 meq via ORAL
  Filled 2021-01-21: qty 2

## 2021-01-21 NOTE — Discharge Instructions (Signed)
I have sent nausea medication and prenatal vitamins to your pharmacy.  Please take as directed. Follow-up with OB/GYN for ongoing prenatal care. Return to the MAU department at [facility] if pregnancy related problems occur.  This is hospital entrance C.

## 2021-06-24 ENCOUNTER — Telehealth: Payer: Self-pay | Admitting: Internal Medicine

## 2021-06-24 NOTE — Telephone Encounter (Signed)
Pt last saw TP 10/17/20 and was told to f/u with CY 3 months from that appt.  Pt has not seen Dr. Maple Hudson since 2017. Pt had OV scheduled with Dr. Maple Hudson in March 2022 but pt no showed for that appt.   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  Patient would like to try an alternative to Adderall.  Endorsing side effects to Adderall. We will try  Nuvigil  Patient education given. Pregnancy test will be obtained today.  Patient is advised of pregnancy contraindication. Once pregnancy test is returned we will send a prescription to her pharmacy.   Plan  Patient Instructions  Begin Nuvigil 150mg  daily .  Pregnancy test today , once results return will send in prescription.  Nap when you can.  Make sure to get enough sleep at night Follow up with Dr.  In 2-3 months and As needed        Called and spoke with pt. Pt said that she is requesting to switch from Nuvigil back to Adderall. Dr. Maple Hudson, please advise on this.  No Known Allergies   Current Outpatient Medications:    Armodafinil (NUVIGIL) 150 MG tablet, Take 1 tablet (150 mg total) by mouth daily. (Patient not taking: Reported on 01/21/2021), Disp: 30 tablet, Rfl: 0   Doxylamine-Pyridoxine (DICLEGIS) 10-10 MG TBEC, Take 1-2 tablets by mouth at bedtime., Disp: 20 tablet, Rfl: 0   Prenatal Vit-Fe Fumarate-FA (PRENATAL COMPLETE) 14-0.4 MG TABS, Take 1 tablet by mouth daily., Disp: 60 tablet, Rfl: 0

## 2021-06-24 NOTE — Telephone Encounter (Signed)
I have called and LM on VM for pt to call back.  

## 2021-06-24 NOTE — Telephone Encounter (Signed)
Patient is returning phone call. Patient phone number is [phone]. 

## 2021-06-24 NOTE — Telephone Encounter (Signed)
Patient is returning phone call. Patient phone number is [phone].

## 2021-06-24 NOTE — Telephone Encounter (Signed)
I have called and LM on VM for the pt to call the office back.  

## 2021-06-25 NOTE — Telephone Encounter (Signed)
Called patient but she did not answer. Left message for her to call back.  

## 2021-06-25 NOTE — Telephone Encounter (Signed)
Patient is returning phone call. Patient phone number is [phone]. Will be available around 2:30 pm.

## 2021-06-25 NOTE — Telephone Encounter (Signed)
To get refills for controlled drugs she has to keep appointments and cannot "no show" Ok to get her a held spot appointment with me when available. Otherwise she can see another sleep PHYSICIAN here, or change to a different practice.

## 2021-06-25 NOTE — Telephone Encounter (Signed)
Attempted to call pt but unable to reach. Left message for her to return call. 

## 2021-06-26 NOTE — Telephone Encounter (Signed)
Called and spoke with pt stating to her the message from CY. Stated to her that we needed to get her scheduled for an appt prior to Dr. Maple Hudson refilling her meds. Appt has been scheduled for pt with CY. Nothing further needed.

## 2021-07-03 NOTE — Progress Notes (Deleted)
Patient ID: Cynthia [name], female    DOB: August 17, 1991, 30 y.o.   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  She has been using Adderall XR 15 mg but says it wears off shortly after lunch time. She is not pregnant now. Not napping regularly.  07/04/21- 30 yoF never smoker followed for Narcolepsy with Hypersomnia Pregnant first trimester as of ED visit for N&V 01/20/21 - previously tried ritalin, armodafinil,  Body weight today- Covid vax- Flu vax-    Review of Systems-see HPI Constitutional:   No-   weight loss, night sweats, fevers, chills,    + fatigue, lassitude. HEENT:   No-  headaches, difficulty swallowing, tooth/dental problems, sore throat,       No-  sneezing, itching, ear ache, nasal congestion, post nasal drip,  CV:  No-   chest pain, orthopnea, PND, swelling in lower extremities, anasarca, dizziness, palpitations Resp: No-   shortness of breath with exertion or at rest.              No-   productive cough,  No non-productive cough,  No-  coughing up of blood.              No-   change in color of mucus.  No- wheezing.   Skin: No-   rash or lesions. GI:  No-   heartburn, indigestion, abdominal pain, nausea, vomiting,  GU:  MS:  No-   joint pain or swelling.   Neuro- nothing unusual   Psych:  No- change in mood or affect. No depression or anxiety.  No memory loss.     Objective:   Physical Exam General- Alert, Oriented, Affect-appropriate, Distress- none acute, well-developed well-nourished, not obese; Skin- rash-none, lesions- none, excoriation- none. Multiple tattoos Lymphadenopathy- none Head- atraumatic            Eyes- Gross vision intact, PERRLA, conjunctivae clear secretions            Ears- Hearing, canals-normal            Nose- Clear, no-Septal dev, mucus, polyps, erosion, perforation             Throat- Mallampati II , mucosa clear , drainage- none, tonsils- atrophic Neck- flexible , trachea midline, no stridor , thyroid nl, carotid no bruit Chest - symmetrical excursion , unlabored           Heart/CV- RRR , no murmur , no gallop  , no rub, nl s1 s2                           - JVD- none , edema- none, stasis changes- none, varices- none           Lung- clear to P&A, wheeze- none, cough- none , dullness-none, rub- none           Chest wall-  Abd-  Br/ Gen/ Rectal- Not done, not indicated Extrem- cyanosis- none, clubbing, none, atrophy- none, strength- nl Neuro- alert and  attentive,

## 2021-07-04 ENCOUNTER — Ambulatory Visit: Payer: Medicaid Other | Admitting: Internal Medicine

## 2022-06-13 IMAGING — US US OB COMP LESS 14 WK
1 series · 14 of 28 positions shown · non-contrast
Comparison: None.

CLINICAL DATA: Pregnant, beta HCG greater than 6444

EXAM:
OBSTETRIC <14 WK US AND TRANSVAGINAL OB US
TECHNIQUE: Both transabdominal and transvaginal ultrasound examinations were
performed for complete evaluation of the gestation as well as the
maternal uterus, adnexal regions, and pelvic cul-de-sac.
Transvaginal technique was performed to assess early pregnancy.

[Series 1: us ob comp less 14 wk · 14 of 80 slices shown]
[im 3/80]
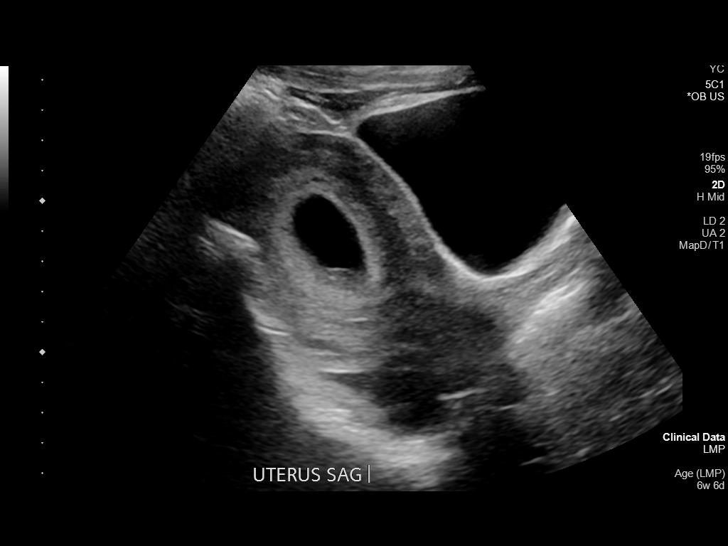
[im 9/80]
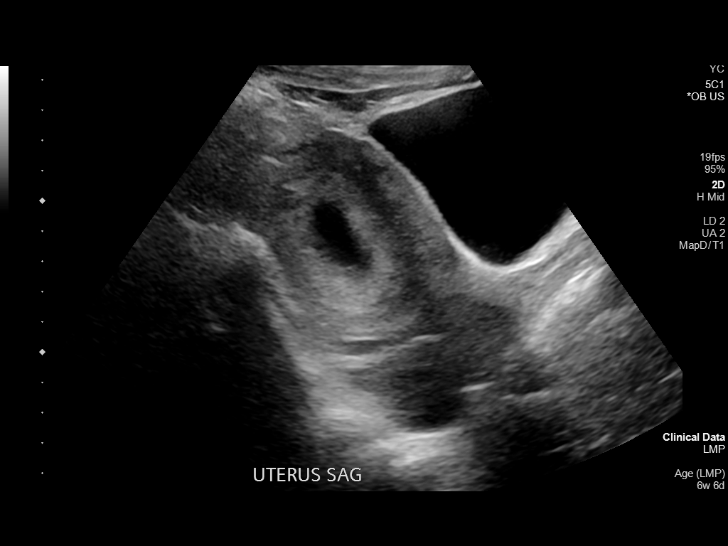
[im 15/80]
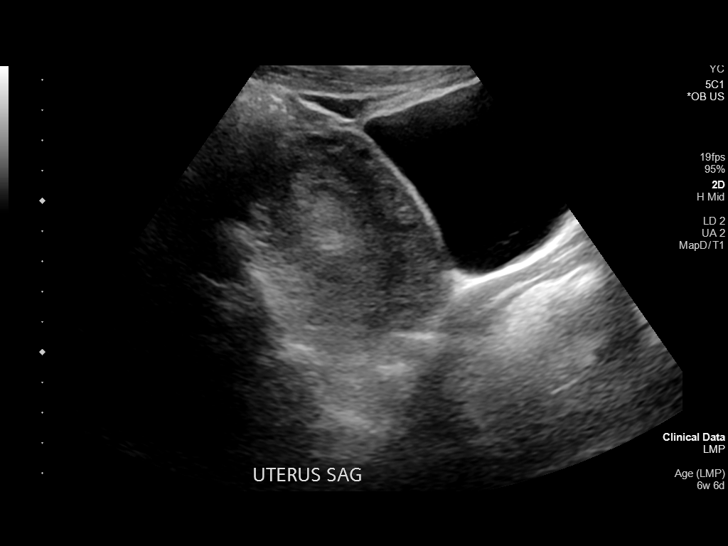
[im 21/80]
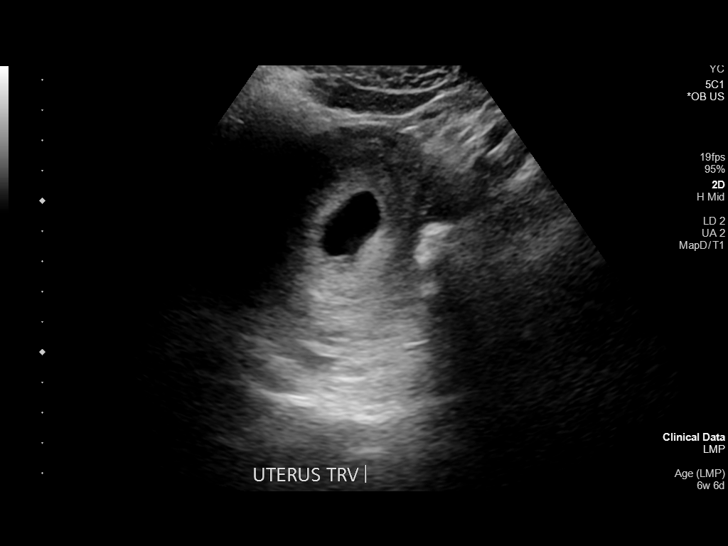
[im 27/80]
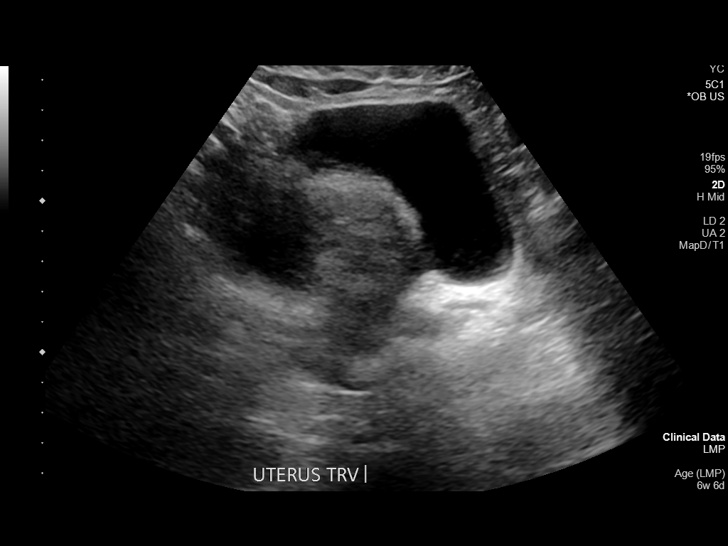
[im 33/80]
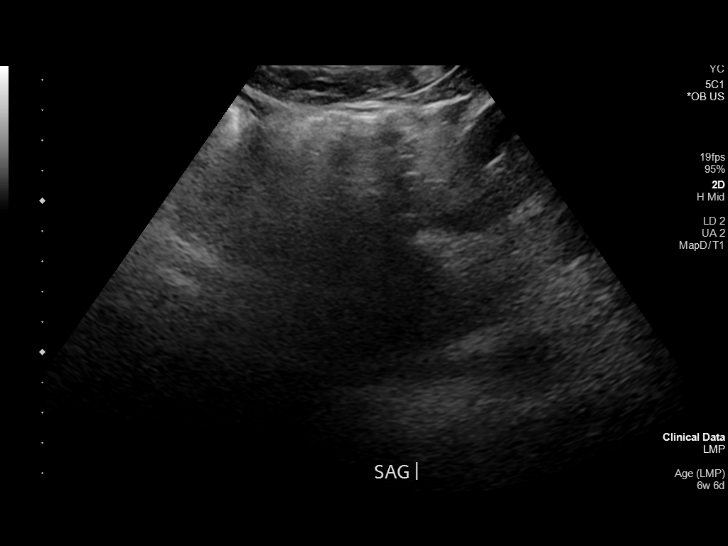
[im 39/80]
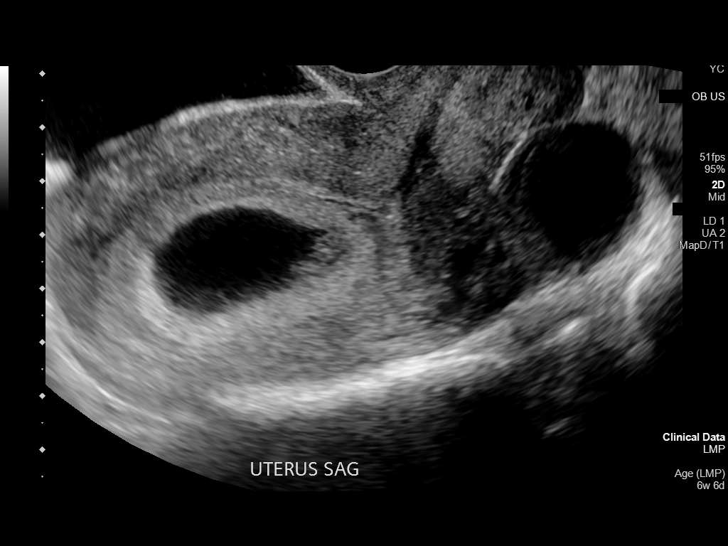
[im 44/80]
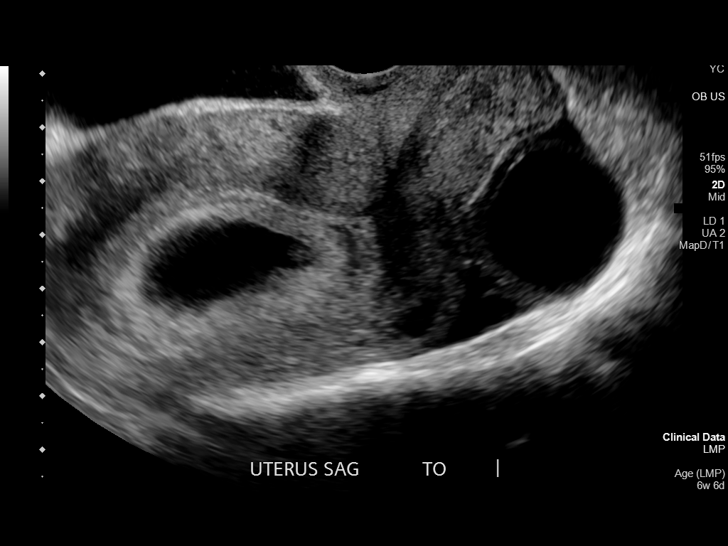
[im 50/80]
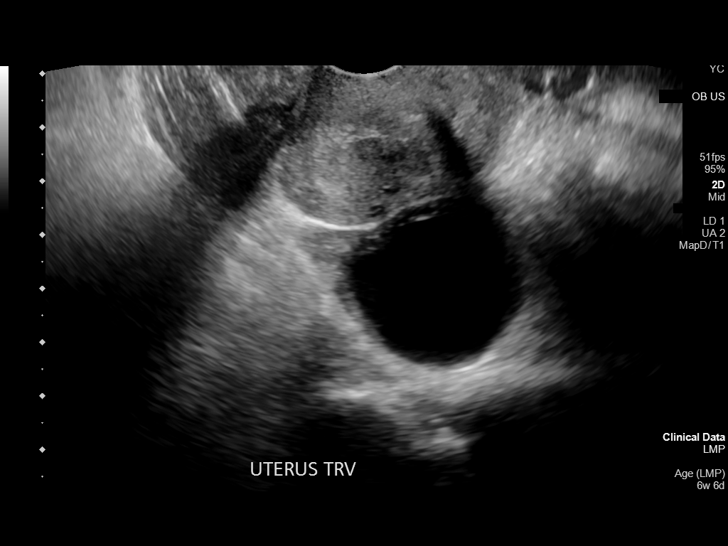
[im 56/80]
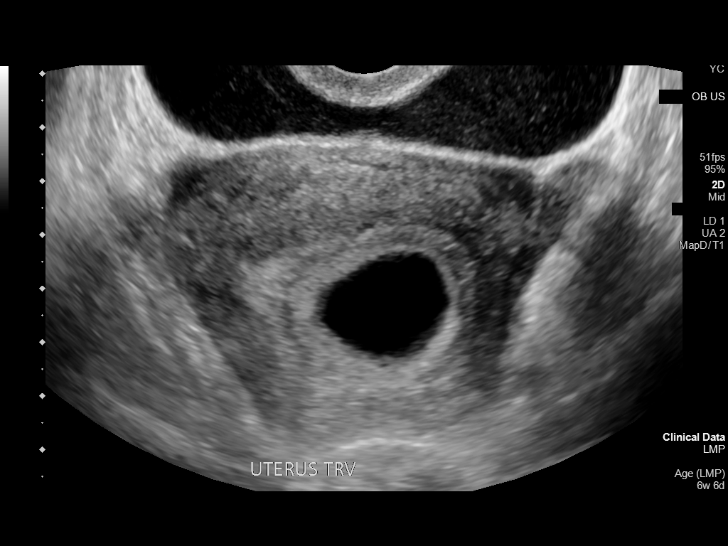
[im 62/80]
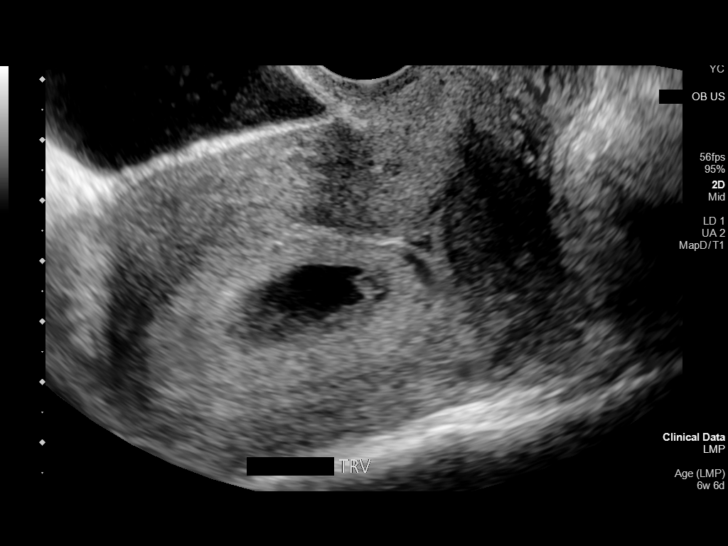
[im 68/80]
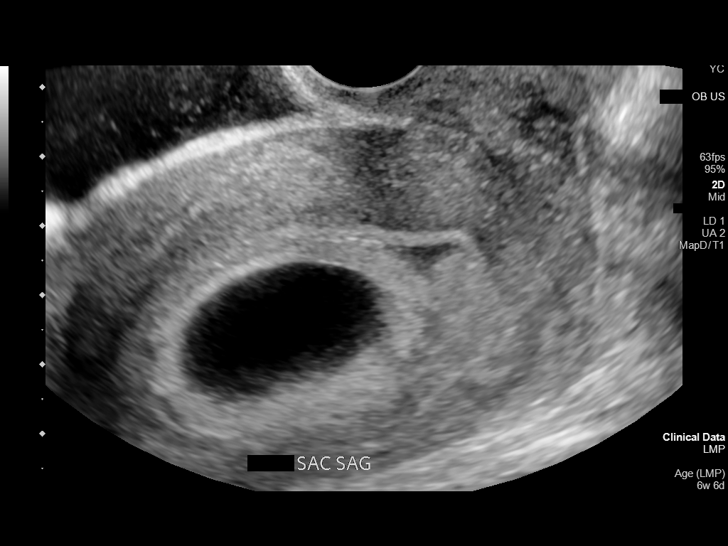
[im 74/80]
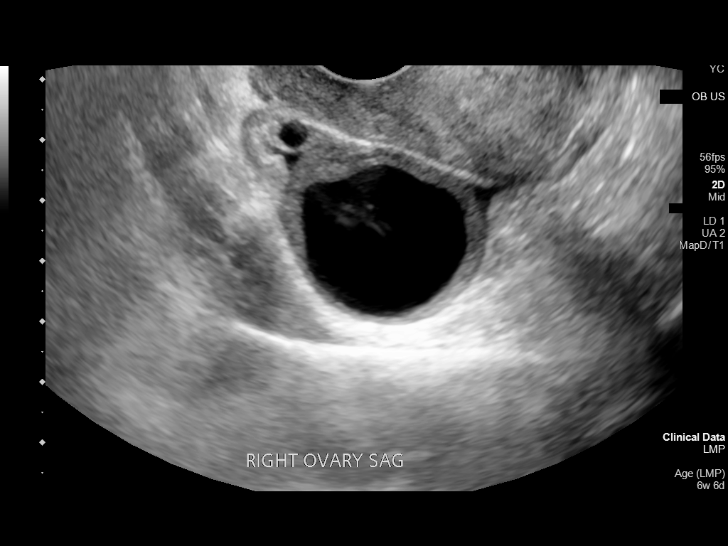
[im 80/80]
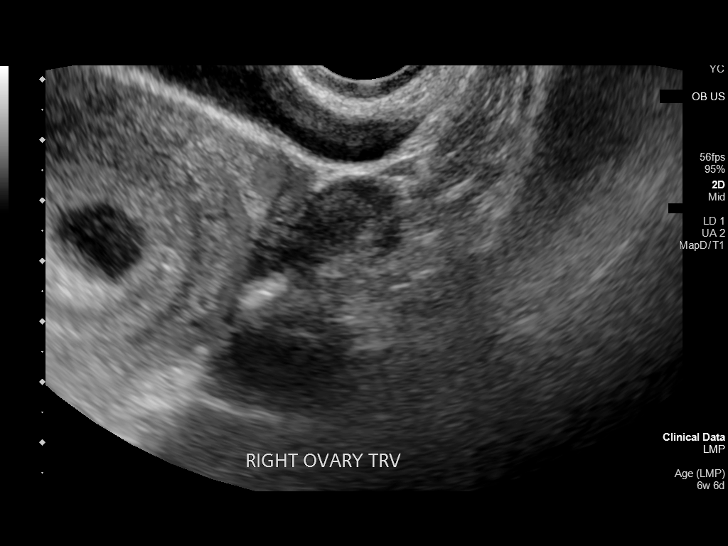

[14 of 28 positions shown; findings below may reference images not displayed]

FINDINGS: Intrauterine gestational sac: Single

Yolk sac:  Visualized.

Embryo:  Visualized.

Cardiac Activity: Visualized.

Heart Rate: 131 bpm

CRL: 8.5 mm   6 w   6 d                  US EDC: 09/09/2021

Subchorionic hemorrhage: Trace subchorionic hemorrhage along the
inferior margin of the gestational sac.

Maternal uterus/adnexae: 2.9 cm corpus luteum cyst right ovary.
Trace free fluid in the cul-de-sac.
IMPRESSION: 1. Single live intrauterine pregnancy as above estimated age 6 weeks
and 6 days.
2. Trace subchorionic hemorrhage.
3. Right ovarian corpus luteum cyst.

## 2022-06-26 ENCOUNTER — Encounter (HOSPITAL_COMMUNITY): Payer: Self-pay | Admitting: Emergency Medicine

## 2022-06-26 ENCOUNTER — Other Ambulatory Visit: Payer: Self-pay

## 2022-06-26 ENCOUNTER — Inpatient Hospital Stay (HOSPITAL_COMMUNITY)
Admission: AD | Admit: 2022-06-26 | Discharge: 2022-06-26 | Disposition: A | Discharge status: Home / Self Care | Payer: Medicaid Other | Attending: Obstetrics and Gynecology | Admitting: Obstetrics and Gynecology

## 2022-06-26 DIAGNOSIS — O99611 Diseases of the digestive system complicating pregnancy, first trimester: Secondary | ICD-10-CM | POA: Diagnosis not present

## 2022-06-26 DIAGNOSIS — Z3A01 Less than 8 weeks gestation of pregnancy: Secondary | ICD-10-CM | POA: Insufficient documentation

## 2022-06-26 DIAGNOSIS — K117 Disturbances of salivary secretion: Secondary | ICD-10-CM | POA: Insufficient documentation

## 2022-06-26 DIAGNOSIS — O219 Vomiting of pregnancy, unspecified: Secondary | ICD-10-CM | POA: Insufficient documentation

## 2022-06-26 LAB — COMPREHENSIVE METABOLIC PANEL
ALT: 14 U/L (ref 0–44)
AST: 20 U/L (ref 15–41)
Albumin: 4.4 g/dL (ref 3.5–5.0)
Alkaline Phosphatase: 38 U/L (ref 38–126)
Anion gap: 15 (ref 5–15)
BUN: 11 mg/dL (ref 6–20)
CO2: 20 mmol/L — ABNORMAL LOW (ref 22–32)
Calcium: 9.9 mg/dL (ref 8.9–10.3)
Chloride: 102 mmol/L (ref 98–111)
Creatinine, Ser: 0.91 mg/dL (ref 0.44–1.00)
GFR, Estimated: >60 mL/min (ref >60)
Glucose, Bld: 104 mg/dL — ABNORMAL HIGH (ref 70–99)
Potassium: 3.4 mmol/L — ABNORMAL LOW (ref 3.5–5.1)
Sodium: 137 mmol/L (ref 135–145)
Total Bilirubin: 1 mg/dL (ref 0.3–1.2)
Total Protein: 8.6 g/dL — ABNORMAL HIGH (ref 6.5–8.1)

## 2022-06-26 LAB — CBC WITH DIFFERENTIAL/PLATELET
Abs Immature Granulocytes: 0.07 10*3/uL (ref 0.00–0.07)
Basophils Absolute: 0 10*3/uL (ref 0.0–0.1)
Basophils Relative: 0 %
Eosinophils Absolute: 0 10*3/uL (ref 0.0–0.5)
Eosinophils Relative: 0 %
HCT: 36.1 % (ref 36.0–46.0)
Hemoglobin: 13.5 g/dL (ref 12.0–15.0)
Immature Granulocytes: 1 %
Lymphocytes Relative: 6 %
Lymphs Abs: 0.8 10*3/uL (ref 0.7–4.0)
MCH: 28.8 pg (ref 26.0–34.0)
MCHC: 37.4 g/dL — ABNORMAL HIGH (ref 30.0–36.0)
MCV: 77.1 fL — ABNORMAL LOW (ref 80.0–100.0)
Monocytes Absolute: 1.2 10*3/uL — ABNORMAL HIGH (ref 0.1–1.0)
Monocytes Relative: 9 %
Neutro Abs: 11.1 10*3/uL — ABNORMAL HIGH (ref 1.7–7.7)
Neutrophils Relative %: 84 %
Platelets: 215 10*3/uL (ref 150–400)
RBC: 4.68 MIL/uL (ref 3.87–5.11)
RDW: 14.2 % (ref 11.5–15.5)
WBC: 13.2 10*3/uL — ABNORMAL HIGH (ref 4.0–10.5)
nRBC: 0 % (ref 0.0–0.2)

## 2022-06-26 LAB — URINALYSIS, ROUTINE W REFLEX MICROSCOPIC
Bilirubin Urine: NEGATIVE
Glucose, UA: NEGATIVE mg/dL
Hgb urine dipstick: NEGATIVE
Ketones, ur: 80 mg/dL — AB
Leukocytes,Ua: NEGATIVE
Nitrite: NEGATIVE
Protein, ur: >=300 mg/dL — AB
Specific Gravity, Urine: 1.033 — ABNORMAL HIGH (ref 1.005–1.030)
pH: 5 (ref 5.0–8.0)

## 2022-06-26 LAB — I-STAT BETA HCG BLOOD, ED (MC, WL, AP ONLY): I-stat hCG, quantitative: >2000 m[IU]/mL — ABNORMAL HIGH (ref <5)

## 2022-06-26 LAB — HCG, QUANTITATIVE, PREGNANCY: hCG, Beta Chain, Quant, S: 75278 m[IU]/mL — ABNORMAL HIGH (ref <5)

## 2022-06-26 LAB — LIPASE, BLOOD: Lipase: 30 U/L (ref 11–51)

## 2022-06-26 MED ORDER — GLYCOPYRROLATE 2 MG PO TABS: 2.0000 mg | ORAL_TABLET | Freq: Three times a day (TID) | ORAL | 0 refills | Status: DC | PRN

## 2022-06-26 MED ORDER — GLYCOPYRROLATE 0.2 MG/ML IJ SOLN
0.2000 mg | Freq: Once | INTRAMUSCULAR | Status: AC
  Administered 2022-06-26: 0.2 mg via INTRAVENOUS
  Filled 2022-06-26: qty 1

## 2022-06-26 MED ORDER — SODIUM CHLORIDE 0.9 % IV SOLN
25.0000 mg | Freq: Once | INTRAVENOUS | Status: AC
  Administered 2022-06-26: 25 mg via INTRAVENOUS
  Filled 2022-06-26: qty 1

## 2022-06-26 MED ORDER — PROMETHAZINE HCL 25 MG PO TABS: 25.0000 mg | ORAL_TABLET | Freq: Four times a day (QID) | ORAL | 0 refills | Status: DC | PRN

## 2022-06-26 MED ORDER — FAMOTIDINE 20 MG PO TABS: 20.0000 mg | ORAL_TABLET | Freq: Every day | ORAL | 0 refills | Status: DC

## 2022-06-26 MED ORDER — FAMOTIDINE IN NACL 20-0.9 MG/50ML-% IV SOLN
20.0000 mg | Freq: Once | INTRAVENOUS | Status: AC
  Administered 2022-06-26: 20 mg via INTRAVENOUS
  Filled 2022-06-26: qty 50

## 2022-06-26 MED ORDER — LACTATED RINGERS IV BOLUS
1000.0000 mL | Freq: Once | INTRAVENOUS | Status: AC
  Administered 2022-06-26: 1000 mL via INTRAVENOUS

## 2022-06-26 NOTE — ED Provider Triage Note (Signed)
Emergency Medicine Provider Triage Evaluation Note  Cynthia [name] , a 31 y.o. female  was evaluated in triage.  Pt complains of nausea and bilious vomiting with decreased appetite and intermittent abdominal pain.  Symptoms started gradually on Wednesday, worsened until today.  Believes she may be pregnant.  Unknown LMP.  Last pregnancy was last year, for which patient stated "I decided not to go through with it".  Denies change in urinary or bowel habits.  Denies vaginal pain, bleeding, or discharge.  Denies pelvic pain, flank pain, RLQ or LLQ pain.  Denies fever, endorses possible chills.  Review of Systems  Positive:  Negative: See above  Physical Exam  BP 135/89 (BP Location: Right Arm)   Pulse (!) 121   Temp 98.2 F (36.8 C) (Oral)   Resp (!) 23   Ht 4\' 11"  (1.499 m)   Wt 63.5 kg   SpO2 99%   BMI 28.28 kg/m  Gen:   Awake, no distress, appears uncomfortable Resp:  Normal effort  MSK:   Moves extremities without difficulty  Other:  Abdomen soft, non-TTP.  Without flank tenderness.  Medical Decision Making  Medically screening exam initiated at 10:17 AM.  Appropriate orders placed.  Cynthia [name] was informed that the remainder of the evaluation will be completed by another provider, this initial triage assessment does not replace that evaluation, and the importance of remaining in the ED until their evaluation is complete.  Zofran given.  I-STAT beta-hCG pending.   Prince Rome, PA-C 89/21/19 1020

## 2022-06-26 NOTE — MAU Note (Signed)
Cynthia [name] is a 31 y.o. at [redacted]w[redacted]d here in MAU reporting: nausea since Wednesday. States she cannot keep anything down. 10 episodes of emesis in the past 24 hours. States she has burning chest pain when it's time to vomit.   LMP: 05/12/2022  Onset of complaint: ongoing  Pain score: 9/10  Vitals:   06/26/22 0951 06/26/22 1110  BP: 135/89 (!) 142/89  Pulse: (!) 121 81  Resp: (!) 23 (!) 24  Temp: 98.2 F (36.8 C) 98.4 F (36.9 C)  SpO2: 99% 100%     FHT: NA  Lab orders placed from triage: none, previously ordered by provider

## 2022-06-26 NOTE — ED Triage Notes (Signed)
Per [facility] pt coming from home c/o nausea/ emesis and poor appetite x 2 days. States she thinks she is pregnant but has not taken a test. 4mg  zofran given by EMS.

## 2022-06-26 NOTE — Discharge Instructions (Signed)
  [facility] Providers          [facility]  [address], [place], [place] [address]  [phone]  [facility]  [address], [place], [place] [address]  [phone]  [facility]  [address], [place], [place] [address]  [phone]  [facility] [place]  [address], [place], [place] [address]  [phone]  [facility]  [address]First floor), [place], [place] [address]  [phone]  [facility]  [address], [place], [place] [address]  [phone]  [facility]  [address], [place], [place] [address]  [phone]  [facility]  [address], [place], [place] [address]  [phone]  [facility]  [address], [place], [place] [address]  [phone]  [facility]  [address], [place], [place] [address]  [phone]  [facility]  [address], [place], [place] [address]  [phone]  [facility]   [address], [place], [place] [address]  [phone]  [facility]  [address], [place], [place] [address]   [phone]  [facility]  [address], , [place] [address]  [phone]         

## 2022-06-26 NOTE — MAU Provider Note (Signed)
History     [id number]  Arrival date and time: 06/26/22 0950    Chief Complaint  Patient presents with   Emesis     HPI Cynthia [name] is a 31 y.o. at [redacted]w[redacted]d who presents for vomiting. Symptoms started a few weeks ago. Reports vomiting countless times per day. States she was taking a medication her sister had but it didn't help. Increased saliva & spitting that is worse today than the vomiting is. Reports mid epigastric burning. Denies fever, diarrhea, vaginal bleeding, or dysuria.    OB History     Gravida  8   Para  2   Term  2   Preterm      AB  5   Living  2      SAB      IAB  5   Ectopic      Multiple  0   Live Births  2           Past Medical History:  Diagnosis Date   Narcolepsy     Past Surgical History:  Procedure Laterality Date   DILATION AND CURETTAGE OF UTERUS      Family History  Problem Relation Age of Onset   Healthy Mother    Healthy Father    Hypertension Other     No Known Allergies  No current facility-administered medications on file prior to encounter.   Current Outpatient Medications on File Prior to Encounter  Medication Sig Dispense Refill   Prenatal Vit-Fe Fumarate-FA (PRENATAL COMPLETE) 14-0.4 MG TABS Take 1 tablet by mouth daily. 60 tablet 0     ROS Pertinent positives and negative per HPI, all others reviewed and negative  Physical Exam   BP 128/80   Pulse 86   Temp 98.4 F (36.9 C) (Oral)   Resp (!) 24   Ht 4\' 11"  (1.499 m)   Wt 61.2 kg   LMP 05/12/2022 (Approximate)   SpO2 100% Comment: room air  BMI 27.27 kg/m   Patient Vitals for the past 24 hrs:  BP Temp Temp src Pulse Resp SpO2 Height Weight  06/26/22 1400 128/80 -- -- 86 -- -- -- --  06/26/22 1140 (!) 149/98 -- -- 69 -- -- -- --  06/26/22 1110 (!) 142/89 98.4 F (36.9 C) Oral 81 (!) 24 100 % -- --  06/26/22 1106 -- -- -- -- -- -- 4\' 11"  (1.499 m) 61.2 kg  06/26/22 0954 -- -- -- -- -- -- 4\' 11"  (1.499 m) 63.5 kg  06/26/22 0951 135/89  98.2 F (36.8 C) Oral (!) 121 (!) 23 99 % -- --  06/26/22 0951 -- -- -- -- -- 100 % -- --    Physical Exam Vitals and nursing note reviewed.  Constitutional:      General: She is not in acute distress.    Appearance: Normal appearance.  HENT:     Head: Normocephalic and atraumatic.  Eyes:     General: No scleral icterus.    Conjunctiva/sclera: Conjunctivae normal.  Pulmonary:     Effort: Pulmonary effort is normal. No respiratory distress.  Abdominal:     General: Abdomen is flat. There is no distension.     Palpations: Abdomen is soft.     Tenderness: There is no abdominal tenderness. There is no rebound.  Skin:    General: Skin is warm and dry.  Neurological:     Mental Status: She is alert.  Psychiatric:        Mood  and Affect: Mood normal.        Behavior: Behavior normal.      Labs Results for orders placed or performed during the hospital encounter of 06/26/22 (from the past 24 hour(s))  Lipase, blood     Status: None   Collection Time: 06/26/22 10:01 AM  Result Value Ref Range   Lipase 30 11 - 51 U/L  Comprehensive metabolic panel     Status: Abnormal   Collection Time: 06/26/22 10:01 AM  Result Value Ref Range   Sodium 137 135 - 145 mmol/L   Potassium 3.4 (L) 3.5 - 5.1 mmol/L   Chloride 102 98 - 111 mmol/L   CO2 20 (L) 22 - 32 mmol/L   Glucose, Bld 104 (H) 70 - 99 mg/dL   BUN 11 6 - 20 mg/dL   Creatinine, Ser 2.33 0.44 - 1.00 mg/dL   Calcium 9.9 8.9 - 00.7 mg/dL   Total Protein 8.6 (H) 6.5 - 8.1 g/dL   Albumin 4.4 3.5 - 5.0 g/dL   AST 20 15 - 41 U/L   ALT 14 0 - 44 U/L   Alkaline Phosphatase 38 38 - 126 U/L   Total Bilirubin 1.0 0.3 - 1.2 mg/dL   GFR, Estimated >62 >26 mL/min   Anion gap 15 5 - 15  hCG, quantitative, pregnancy     Status: Abnormal   Collection Time: 06/26/22 10:01 AM  Result Value Ref Range   hCG, Beta Chain, Quant, S 75,278 (H) <5 mIU/mL  CBC with Differential     Status: Abnormal   Collection Time: 06/26/22 10:15 AM  Result  Value Ref Range   WBC 13.2 (H) 4.0 - 10.5 K/uL   RBC 4.68 3.87 - 5.11 MIL/uL   Hemoglobin 13.5 12.0 - 15.0 g/dL   HCT 33.3 54.5 - 62.5 %   MCV 77.1 (L) 80.0 - 100.0 fL   MCH 28.8 26.0 - 34.0 pg   MCHC 37.4 (H) 30.0 - 36.0 g/dL   RDW 63.8 93.7 - 34.2 %   Platelets 215 150 - 400 K/uL   nRBC 0.0 0.0 - 0.2 %   Neutrophils Relative % 84 %   Neutro Abs 11.1 (H) 1.7 - 7.7 K/uL   Lymphocytes Relative 6 %   Lymphs Abs 0.8 0.7 - 4.0 K/uL   Monocytes Relative 9 %   Monocytes Absolute 1.2 (H) 0.1 - 1.0 K/uL   Eosinophils Relative 0 %   Eosinophils Absolute 0.0 0.0 - 0.5 K/uL   Basophils Relative 0 %   Basophils Absolute 0.0 0.0 - 0.1 K/uL   Immature Granulocytes 1 %   Abs Immature Granulocytes 0.07 0.00 - 0.07 K/uL  I-Stat beta hCG blood, ED     Status: Abnormal   Collection Time: 06/26/22 10:18 AM  Result Value Ref Range   I-stat hCG, quantitative >2,000.0 (H) <5 mIU/mL   Comment 3          Urinalysis, Routine w reflex microscopic Urine, Clean Catch     Status: Abnormal   Collection Time: 06/26/22 11:19 AM  Result Value Ref Range   Color, Urine AMBER (A) YELLOW   APPearance HAZY (A) CLEAR   Specific Gravity, Urine 1.033 (H) 1.005 - 1.030   pH 5.0 5.0 - 8.0   Glucose, UA NEGATIVE NEGATIVE mg/dL   Hgb urine dipstick NEGATIVE NEGATIVE   Bilirubin Urine NEGATIVE NEGATIVE   Ketones, ur 80 (A) NEGATIVE mg/dL   Protein, ur >=876 (A) NEGATIVE mg/dL   Nitrite NEGATIVE NEGATIVE  Leukocytes,Ua NEGATIVE NEGATIVE   RBC / HPF 0-5 0 - 5 RBC/hpf   WBC, UA 0-5 0 - 5 WBC/hpf   Bacteria, UA FEW (A) NONE SEEN   Squamous Epithelial / LPF 0-5 0 - 5   Mucus PRESENT     Imaging No results found.  MAU Course  Procedures Lab Orders         Lipase, blood         Comprehensive metabolic panel         Urinalysis, Routine w reflex microscopic         CBC with Differential         hCG, quantitative, pregnancy         I-Stat beta hCG blood, ED    Meds ordered this encounter  Medications    lactated ringers bolus 1,000 mL   promethazine (PHENERGAN) 25 mg in sodium chloride 0.9 % 50 mL IVPB   famotidine (PEPCID) IVPB 20 mg premix   glycopyrrolate (ROBINUL) injection 0.2 mg   glycopyrrolate (ROBINUL) 2 MG tablet    Sig: Take 1 tablet (2 mg total) by mouth 3 (three) times daily as needed.    Dispense:  90 tablet    Refill:  0    Order Specific Question:   Supervising Provider    Answer:   Lynnda Shields A [[id number]]   promethazine (PHENERGAN) 25 MG tablet    Sig: Take 1 tablet (25 mg total) by mouth every 6 (six) hours as needed for nausea or vomiting.    Dispense:  30 tablet    Refill:  0    Order Specific Question:   Supervising Provider    Answer:   Lynnda Shields A [[id number]]   famotidine (PEPCID) 20 MG tablet    Sig: Take 1 tablet (20 mg total) by mouth daily.    Dispense:  30 tablet    Refill:  0    Order Specific Question:   Supervising Provider    Answer:   Griffin Basil [[id number]]   Imaging Orders  No imaging studies ordered today    MDM Patient presents for persistent nausea & vomiting. Symptoms improved with IV fluids, phenergan, pepcid, & robinul. Will prescribe meds  Labs ordered in [facility] prior to patient coming to MAU. Results reviewed.  Assessment and Plan   1. Nausea/vomiting in pregnancy   2. Ptyalism   3. [redacted] weeks gestation of pregnancy    -Rx phenergan, robinul, & pepcid -start prenatal care -reviewed reasons to return to [place], NP 06/26/22 7:38 PM

## 2022-09-21 NOTE — L&D Delivery Note (Addendum)
Delivery Note    Patient Name: Cynthia [name] DOB: [birth year] MRN: [id number]  Date of admission: 08/01/2023 Delivering MD: [place] Layne Date of delivery: 08/01/2023 Type of delivery: SVD  Newborn Data: Live born female  Birth Weight:   APGAR: 8, 9  Newborn Delivery   Birth date/time: 08/01/2023 17:52:00 Delivery type: Vaginal, Spontaneous     [name], 32 y.o., @ [redacted]w[redacted]d,  [id number], who was admitted for IOL for postdate with CHTN no meds. . I was called to the room when she progressed 2+ station in the second stage of labor.  She pushed for 10/min.  She delivered a viable infant, cephalic and restituted to the LOA position over an intact perineum.  A nuchal cord   was not identified. The baby was placed on maternal abdomen while initial step of NRP were perfmored (Dry, Stimulated, and warmed). Hat placed on baby for thermoregulation. Delayed cord clamping was performed for 2 minutes.  Cord double clamped and cut.  Cord cut by FOB. Apgar scores were 8 and 9. Prophylactic Pitocin was started in the third stage of labor for active management. The placenta delivered spontaneously, shultz, with a 3 vessel cord and was sent to Path related noted not complete and had to do a manual removal of placenta fragments and Unasyn 3gm once dose given. .  Inspection revealed 1st degree. An examination of the vaginal vault and cervix was free from lacerations. The uterus was firm, bleeding stable.  The repair was no indicated and hemostatic. Marland Kitchen   Umbilical artery blood gas were not sent.  There were no complications during the procedure.  Mom and baby skin to skin following delivery. Left in stable condition.  Maternal Info: Anesthesia: Epidural Episiotomy: no Lacerations:  1st Suture Repair: no Est. Blood Loss (mL):   Newborn Info:  Baby Sex: female Circumcision: N/A Babies Name: Malayah APGAR (1 MIN): 8  APGAR (5 MINS): 9  APGAR (10 MINS):     Mom to postpartum.  Baby to Couplet  care / Skin to Skin.  Delivery Report:    Review the Delivery Report for details.   [facility] CNM, FNP-C, PMHNP-BC  [address]  [place], [place] [address]  Cell: [phone]  Office Phone: [phone] Fax: [phone] 08/01/2023  6:15 PM
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
## 2022-11-13 ENCOUNTER — Ambulatory Visit: Payer: Medicaid Other | Admitting: Internal Medicine

## 2022-12-09 ENCOUNTER — Inpatient Hospital Stay (HOSPITAL_COMMUNITY)
Admission: AD | Admit: 2022-12-09 | Discharge: 2022-12-09 | Disposition: A | Discharge status: Home / Self Care | Payer: Medicaid Other | Attending: Obstetrics & Gynecology | Admitting: Obstetrics & Gynecology

## 2022-12-09 ENCOUNTER — Encounter (HOSPITAL_COMMUNITY): Payer: Self-pay | Admitting: Obstetrics & Gynecology

## 2022-12-09 ENCOUNTER — Ambulatory Visit (INDEPENDENT_AMBULATORY_CARE_PROVIDER_SITE_OTHER): Payer: Medicaid Other

## 2022-12-09 DIAGNOSIS — Z3201 Encounter for pregnancy test, result positive: Secondary | ICD-10-CM | POA: Diagnosis not present

## 2022-12-09 DIAGNOSIS — Z3A01 Less than 8 weeks gestation of pregnancy: Secondary | ICD-10-CM | POA: Diagnosis not present

## 2022-12-09 DIAGNOSIS — O219 Vomiting of pregnancy, unspecified: Secondary | ICD-10-CM

## 2022-12-09 DIAGNOSIS — K117 Disturbances of salivary secretion: Secondary | ICD-10-CM

## 2022-12-09 DIAGNOSIS — O10919 Unspecified pre-existing hypertension complicating pregnancy, unspecified trimester: Secondary | ICD-10-CM | POA: Insufficient documentation

## 2022-12-09 DIAGNOSIS — O99611 Diseases of the digestive system complicating pregnancy, first trimester: Secondary | ICD-10-CM | POA: Insufficient documentation

## 2022-12-09 DIAGNOSIS — N912 Amenorrhea, unspecified: Secondary | ICD-10-CM

## 2022-12-09 LAB — COMPREHENSIVE METABOLIC PANEL
ALT: 17 U/L (ref 0–44)
AST: 18 U/L (ref 15–41)
Albumin: 4.5 g/dL (ref 3.5–5.0)
Alkaline Phosphatase: 41 U/L (ref 38–126)
Anion gap: 12 (ref 5–15)
BUN: 16 mg/dL (ref 6–20)
CO2: 24 mmol/L (ref 22–32)
Calcium: 9.7 mg/dL (ref 8.9–10.3)
Chloride: 100 mmol/L (ref 98–111)
Creatinine, Ser: 0.89 mg/dL (ref 0.44–1.00)
GFR, Estimated: >60 mL/min (ref >60)
Glucose, Bld: 110 mg/dL — ABNORMAL HIGH (ref 70–99)
Potassium: 3.1 mmol/L — ABNORMAL LOW (ref 3.5–5.1)
Sodium: 136 mmol/L (ref 135–145)
Total Bilirubin: 1.3 mg/dL — ABNORMAL HIGH (ref 0.3–1.2)
Total Protein: 8.6 g/dL — ABNORMAL HIGH (ref 6.5–8.1)

## 2022-12-09 LAB — POCT URINE PREGNANCY: Preg Test, Ur: POSITIVE — AB

## 2022-12-09 MED ORDER — ONDANSETRON 4 MG PO TBDP: 4.0000 mg | ORAL_TABLET | Freq: Three times a day (TID) | ORAL | 2 refills | Status: DC | PRN

## 2022-12-09 MED ORDER — PROMETHAZINE HCL 25 MG RE SUPP: 25.0000 mg | Freq: Four times a day (QID) | RECTAL | 0 refills | Status: AC | PRN

## 2022-12-09 MED ORDER — LACTATED RINGERS IV BOLUS: 1000.0000 mL | Freq: Once | INTRAVENOUS | Status: DC

## 2022-12-09 MED ORDER — PROCHLORPERAZINE EDISYLATE 10 MG/2ML IJ SOLN
10.0000 mg | Freq: Once | INTRAMUSCULAR | Status: AC
  Administered 2022-12-09: 10 mg via INTRAVENOUS
  Filled 2022-12-09: qty 2

## 2022-12-09 MED ORDER — LACTATED RINGERS IV BOLUS
1000.0000 mL | Freq: Once | INTRAVENOUS | Status: AC
  Administered 2022-12-09: 1000 mL via INTRAVENOUS

## 2022-12-09 MED ORDER — PROMETHAZINE HCL 25 MG PO TABS: 25.0000 mg | ORAL_TABLET | Freq: Four times a day (QID) | ORAL | 0 refills | Status: DC | PRN

## 2022-12-09 MED ORDER — GLYCOPYRROLATE 0.2 MG/ML IJ SOLN
0.1000 mg | Freq: Once | INTRAMUSCULAR | Status: AC
  Administered 2022-12-09: 0.1 mg via INTRAVENOUS
  Filled 2022-12-09: qty 1

## 2022-12-09 MED ORDER — ONDANSETRON HCL 4 MG/2ML IJ SOLN
4.0000 mg | Freq: Once | INTRAMUSCULAR | Status: AC
  Administered 2022-12-09: 4 mg via INTRAVENOUS
  Filled 2022-12-09: qty 2

## 2022-12-09 MED ORDER — PROMETHAZINE HCL 25 MG PO TABS: 25.0000 mg | ORAL_TABLET | Freq: Four times a day (QID) | ORAL | 3 refills | Status: AC | PRN

## 2022-12-09 MED ORDER — PRENATAL COMPLETE 14-0.4 MG PO TABS: 1.0000 | ORAL_TABLET | Freq: Every day | ORAL | 2 refills | Status: AC

## 2022-12-09 MED ORDER — NIFEDIPINE ER OSMOTIC RELEASE 30 MG PO TB24: 30.0000 mg | ORAL_TABLET | Freq: Every day | ORAL | 5 refills | Status: DC

## 2022-12-09 MED ORDER — GLYCOPYRROLATE 2 MG PO TABS: 2.0000 mg | ORAL_TABLET | Freq: Three times a day (TID) | ORAL | 3 refills | Status: DC | PRN

## 2022-12-09 MED ORDER — PROCHLORPERAZINE MALEATE 10 MG PO TABS: 10.0000 mg | ORAL_TABLET | Freq: Four times a day (QID) | ORAL | 0 refills | Status: AC | PRN

## 2022-12-09 NOTE — Progress Notes (Signed)
Cynthia [name] presents today for UPT. She complains of nausea with vomiting for 4 days.pt has not been able to eat or drink , pt does not feel well at all. Provided ice chips and Rx sent per protocol for Nausea. Pt c/o dehydration. Currently spitting and had feeling as if she is going to vomit. Pt trying to hold water down down.  LMP:10/24/22    OBJECTIVE: Appears well, in no apparent distress.  OB History     Gravida  8   Para  2   Term  2   Preterm      AB  5   Living  2      SAB      IAB  5   Ectopic      Multiple  0   Live Births  2          Home UPT Result: Positive In-Office UPT result: Positive I have reviewed the patient's medical, obstetrical, social, and family histories, and medications.   ASSESSMENT: Positive pregnancy test  PLAN Prenatal care to be completed at: [facility] Pt advised to go to MAU due to not being able to eat or drink x 3 days.

## 2022-12-09 NOTE — MAU Provider Note (Signed)
MAU Provider Note  History  [id number]  Arrival date and time: 12/09/22 1124   Chief Complaint  Patient presents with   Nausea   Emesis     HPI Cynthia [name] is a 32 y.o. [id number] at [redacted]w[redacted]d by LMP with PMHx notable for marijuana use and narcolepsy, who presents for nausea and vomiting.  She started feeling nauseous Saturday morning after trying to eat breakfast sandwich.  She has been nauseous and unable to keep anything down except for 2 popsicles and a pickle for the last 3 to 4 days.  She has been trying to push liquids including water and grape soda but also has been vomiting that up.  Last week she felt completely fine and was able to eat normally.  She notes that this vomiting and nausea is typical for her when she is pregnant.  She had a positive pregnancy test last Monday.  She came in to establish prenatal care today and to get a pregnancy test.  Urine pregnancy test was positive at the clinic and they sent her here for treatment and evaluation of vomiting. No profuse bloody or black vomit. Maybe noticed some streaks of red. No diarrhea or constipation. No fever. No abdominal pain.   Vaginal bleeding: No LOF: No Fetal Movement: No Contractions: No     Pertinent Gynecological History: Menses: regular, every month Contraception: none Previous GYN Procedures:  multiple previous elective abortions    Past Medical History:  Diagnosis Date   Narcolepsy     Past Surgical History:  Procedure Laterality Date   DILATION AND CURETTAGE OF UTERUS      Family History  Problem Relation Age of Onset   Healthy Mother    Healthy Father    Hypertension Other     Social History   Socioeconomic History   Marital status: Single    Spouse name: Not on file   Number of children: 1   Years of education: Not on file   Highest education level: Not on file  Occupational History   Occupation: Student    Comment: [employer]  Tobacco Use   Smoking status: Never   Smokeless tobacco: Never   Substance and Sexual Activity   Alcohol use: No    Comment: socially   Drug use: No    Frequency: 7.0 times per week    Types: Marijuana   Sexual activity: Not Currently    Birth control/protection: None  Other Topics Concern   Not on file  Social History Narrative   Not on file   Social Determinants of Health   Financial Resource Strain: Not on file  Food Insecurity: Not on file  Transportation Needs: Not on file  Physical Activity: Not on file  Stress: Not on file  Social Connections: Not on file  Intimate Partner Violence: Not on file    No Known Allergies  No current facility-administered medications on file prior to encounter.   Current Outpatient Medications on File Prior to Encounter  Medication Sig Dispense Refill   promethazine (PHENERGAN) 25 MG tablet Take 1 tablet (25 mg total) by mouth every 6 (six) hours as needed for nausea or vomiting. 30 tablet 0   famotidine (PEPCID) 20 MG tablet Take 1 tablet (20 mg total) by mouth daily. 30 tablet 0   glycopyrrolate (ROBINUL) 2 MG tablet Take 1 tablet (2 mg total) by mouth 3 (three) times daily as needed. 90 tablet 0   Prenatal Vit-Fe Fumarate-FA (PRENATAL COMPLETE) 14-0.4 MG TABS Take 1 tablet  by mouth daily. 60 tablet 0   promethazine (PHENERGAN) 25 MG tablet Take 1 tablet (25 mg total) by mouth every 6 (six) hours as needed for nausea or vomiting. 30 tablet 0     Review of Systems  Constitutional:  Negative for chills, diaphoresis and fever.  HENT:  Negative for congestion, sinus pain and sore throat.   Eyes:  Negative for blurred vision.  Respiratory:  Negative for cough.   Cardiovascular:  Negative for chest pain, palpitations and leg swelling.  Gastrointestinal:  Positive for nausea and vomiting. Negative for abdominal pain, blood in stool, constipation and diarrhea.  Genitourinary:  Negative for dysuria.  Skin:  Negative for rash.    Physical Exam   BP (!) 149/107 (BP Location: Right Arm)   Pulse (!) 109    Temp 98 F (36.7 C)   Resp 20   Ht 4\' 11"  (1.499 m)   Wt 60.6 kg   LMP 10/25/2022   SpO2 100%   Breastfeeding Unknown   BMI 26.96 kg/m   Patient Vitals for the past 24 hrs:  BP Temp Pulse Resp SpO2 Height Weight  12/09/22 1222 (!) 149/107 98 F (36.7 C) (!) 109 20 100 % 4\' 11"  (1.499 m) 60.6 kg    Physical Exam Constitutional:      General: She is not in acute distress.    Appearance: She is not toxic-appearing.     Comments: Tired appearing female, appears stated age  HENT:     Right Ear: External ear normal.     Left Ear: External ear normal.     Nose: Nose normal.     Mouth/Throat:     Mouth: Mucous membranes are moist.     Comments: Intermittently spitting into cup Eyes:     Extraocular Movements: Extraocular movements intact.     Conjunctiva/sclera: Conjunctivae normal.  Cardiovascular:     Rate and Rhythm: Normal rate and regular rhythm.     Pulses: Normal pulses.  Pulmonary:     Effort: Pulmonary effort is normal.     Breath sounds: Normal breath sounds.  Abdominal:     General: Bowel sounds are normal.     Palpations: Abdomen is soft.  Musculoskeletal:        General: Normal range of motion.     Cervical back: Normal range of motion.  Skin:    General: Skin is warm and dry.     Capillary Refill: Capillary refill takes less than 2 seconds.  Neurological:     Mental Status: She is alert and oriented to person, place, and time.  Psychiatric:        Mood and Affect: Mood normal.        Behavior: Behavior normal.     Bedside Ultrasound Intrauterine gestational sac visualized.  Labs Results for orders placed or performed in visit on 12/09/22 (from the past 24 hour(s))  POCT urine pregnancy     Status: Abnormal   Collection Time: 12/09/22 11:14 AM  Result Value Ref Range   Preg Test, Ur Positive (A) Negative    Imaging No results found.  MAU Course  Procedures  Lab Orders         Urinalysis, Routine w reflex microscopic -Urine, Clean Catch          Comprehensive metabolic panel    Meds ordered this encounter  Medications   lactated ringers bolus 1,000 mL   DISCONTD: lactated ringers bolus 1,000 mL   ondansetron (ZOFRAN) injection 4 mg  glycopyrrolate (ROBINUL) injection 0.1 mg   Imaging Orders  No imaging studies ordered today    MDM Presents with nausea and vomiting in first trimester pregnancy. Improved with fluids, robinul and ondansetron. Tolerated PO challenge. Counseled on marijuana cessation given hyperemesis. BMP appropriate, no need for electrolyte supplementation.   Assessment and Plan   Nausea/vomiting in pregnancy Ptyalism [redacted] wks gestation of pregnancy  - Rx glycopyrrolate, phenergan, compazine and ondansetron. - establish prenatal care   Gave return precautions.  Follow-up with primary OB.  Dispo: discharged to home in stable condition.   Enid Baas, MD Family Medicine, PGY1 12/09/22  1:02 PM

## 2022-12-09 NOTE — MAU Note (Signed)
Cynthia [name] is a 32 y.o. at [redacted]w[redacted]d here in MAU reporting: feeling nauseous  again, can't take it.  Been feeling like this since Sat. Pt noted to be spitting. States is throwing up, though has not had emesis bag since arriving.  No pain, no bleeding or LOF.  Unsure if baby is moving Onset of complaint: Sat Pain score: 0 Vitals:   12/09/22 1222  BP: (!) 149/107  Pulse: (!) 109  Resp: 20  Temp: 98 F (36.7 C)  SpO2: 100%     [id number] to clearly hear  Lab orders placed from triage:  urine

## 2022-12-23 DIAGNOSIS — R1110 Vomiting, unspecified: Secondary | ICD-10-CM | POA: Insufficient documentation

## 2022-12-23 DIAGNOSIS — I10 Essential (primary) hypertension: Secondary | ICD-10-CM | POA: Insufficient documentation

## 2023-01-21 ENCOUNTER — Encounter: Payer: Medicaid Other | Admitting: Obstetrics and Gynecology

## 2023-01-25 LAB — OB RESULTS CONSOLE GC/CHLAMYDIA
Chlamydia: NEGATIVE
Neisseria Gonorrhea: NEGATIVE

## 2023-01-28 LAB — OB RESULTS CONSOLE HEPATITIS B SURFACE ANTIGEN: Hepatitis B Surface Ag: NEGATIVE

## 2023-01-28 LAB — OB RESULTS CONSOLE RPR: RPR: NONREACTIVE

## 2023-01-28 LAB — HEPATITIS C ANTIBODY: HCV Ab: NEGATIVE

## 2023-01-28 LAB — OB RESULTS CONSOLE ANTIBODY SCREEN: Antibody Screen: NEGATIVE

## 2023-01-28 LAB — OB RESULTS CONSOLE RUBELLA ANTIBODY, IGM: Rubella: IMMUNE

## 2023-01-28 LAB — OB RESULTS CONSOLE HIV ANTIBODY (ROUTINE TESTING): HIV: NONREACTIVE

## 2023-03-12 ENCOUNTER — Other Ambulatory Visit: Payer: Self-pay | Admitting: Obstetrics and Gynecology

## 2023-03-12 DIAGNOSIS — Z363 Encounter for antenatal screening for malformations: Secondary | ICD-10-CM

## 2023-03-15 ENCOUNTER — Other Ambulatory Visit: Payer: Medicaid Other

## 2023-03-15 ENCOUNTER — Ambulatory Visit: Payer: Medicaid Other

## 2023-03-15 DIAGNOSIS — D582 Other hemoglobinopathies: Secondary | ICD-10-CM | POA: Insufficient documentation

## 2023-03-19 ENCOUNTER — Other Ambulatory Visit: Payer: Self-pay | Admitting: *Deleted

## 2023-03-19 ENCOUNTER — Encounter: Payer: Self-pay | Admitting: *Deleted

## 2023-03-19 ENCOUNTER — Ambulatory Visit: Payer: Medicaid Other | Attending: Obstetrics and Gynecology | Admitting: *Deleted

## 2023-03-19 ENCOUNTER — Ambulatory Visit (HOSPITAL_BASED_OUTPATIENT_CLINIC_OR_DEPARTMENT_OTHER): Payer: Medicaid Other

## 2023-03-19 VITALS — BP 134/80 | HR 79

## 2023-03-19 DIAGNOSIS — O10912 Unspecified pre-existing hypertension complicating pregnancy, second trimester: Secondary | ICD-10-CM

## 2023-03-19 DIAGNOSIS — Z3A21 21 weeks gestation of pregnancy: Secondary | ICD-10-CM | POA: Diagnosis not present

## 2023-03-19 DIAGNOSIS — Z363 Encounter for antenatal screening for malformations: Secondary | ICD-10-CM | POA: Insufficient documentation

## 2023-03-19 DIAGNOSIS — O322XX0 Maternal care for transverse and oblique lie, not applicable or unspecified: Secondary | ICD-10-CM | POA: Insufficient documentation

## 2023-03-19 DIAGNOSIS — O09292 Supervision of pregnancy with other poor reproductive or obstetric history, second trimester: Secondary | ICD-10-CM | POA: Diagnosis not present

## 2023-03-19 DIAGNOSIS — D582 Other hemoglobinopathies: Secondary | ICD-10-CM

## 2023-03-19 DIAGNOSIS — Z8759 Personal history of other complications of pregnancy, childbirth and the puerperium: Secondary | ICD-10-CM | POA: Diagnosis not present

## 2023-04-16 ENCOUNTER — Ambulatory Visit: Payer: Medicaid Other | Admitting: *Deleted

## 2023-04-16 ENCOUNTER — Ambulatory Visit (HOSPITAL_BASED_OUTPATIENT_CLINIC_OR_DEPARTMENT_OTHER): Payer: Medicaid Other

## 2023-04-16 ENCOUNTER — Ambulatory Visit: Payer: Medicaid Other

## 2023-04-16 ENCOUNTER — Other Ambulatory Visit: Payer: Self-pay | Admitting: *Deleted

## 2023-04-16 VITALS — BP 128/65 | HR 76

## 2023-04-16 DIAGNOSIS — O09292 Supervision of pregnancy with other poor reproductive or obstetric history, second trimester: Secondary | ICD-10-CM

## 2023-04-16 DIAGNOSIS — O10012 Pre-existing essential hypertension complicating pregnancy, second trimester: Secondary | ICD-10-CM

## 2023-04-16 DIAGNOSIS — O09299 Supervision of pregnancy with other poor reproductive or obstetric history, unspecified trimester: Secondary | ICD-10-CM

## 2023-04-16 DIAGNOSIS — D582 Other hemoglobinopathies: Secondary | ICD-10-CM | POA: Diagnosis not present

## 2023-04-16 DIAGNOSIS — O10912 Unspecified pre-existing hypertension complicating pregnancy, second trimester: Secondary | ICD-10-CM | POA: Insufficient documentation

## 2023-04-16 DIAGNOSIS — O358XX0 Maternal care for other (suspected) fetal abnormality and damage, not applicable or unspecified: Secondary | ICD-10-CM | POA: Diagnosis not present

## 2023-04-16 DIAGNOSIS — O365920 Maternal care for other known or suspected poor fetal growth, second trimester, not applicable or unspecified: Secondary | ICD-10-CM | POA: Diagnosis not present

## 2023-04-16 DIAGNOSIS — Z3A25 25 weeks gestation of pregnancy: Secondary | ICD-10-CM

## 2023-04-16 DIAGNOSIS — O99112 Other diseases of the blood and blood-forming organs and certain disorders involving the immune mechanism complicating pregnancy, second trimester: Secondary | ICD-10-CM | POA: Diagnosis not present

## 2023-05-12 ENCOUNTER — Ambulatory Visit: Payer: Medicaid Other

## 2023-06-02 ENCOUNTER — Other Ambulatory Visit: Payer: Self-pay

## 2023-06-04 ENCOUNTER — Ambulatory Visit: Payer: Medicaid Other | Attending: Maternal & Fetal Medicine

## 2023-06-25 LAB — OB RESULTS CONSOLE GBS: GBS: NEGATIVE

## 2023-07-21 ENCOUNTER — Other Ambulatory Visit: Payer: Self-pay | Admitting: Obstetrics and Gynecology

## 2023-07-26 ENCOUNTER — Telehealth (HOSPITAL_COMMUNITY): Payer: Self-pay | Admitting: *Deleted

## 2023-07-26 NOTE — Telephone Encounter (Signed)
Preadmission screen  

## 2023-07-28 ENCOUNTER — Encounter (HOSPITAL_COMMUNITY): Payer: Self-pay | Admitting: *Deleted

## 2023-07-28 ENCOUNTER — Telehealth (HOSPITAL_COMMUNITY): Payer: Self-pay | Admitting: *Deleted

## 2023-07-28 NOTE — Telephone Encounter (Signed)
Preadmission screen  

## 2023-07-30 NOTE — H&P (Signed)
OB ADMISSION/ HISTORY & PHYSICAL:  Cynthia [name] is a 32 y.o. female [id number] [redacted]w[redacted]d presenting for IOL. Hx of CHTN managed on procardia. Potential for SGA - fetal growth in the 10th percentile at 28 wks and then the 20th percentile at 38+5 wks.   History of current pregnancy: [id number]   Patient entered care with [facility] at 12 wks.   EDC 07/25/23 by US   Anatomy scan:  complete w/ fundal posterior placenta.   Antenatal testing: for [facility]  Last evaluation: 38+5 wks cephalic. 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   Hypertensive disorder 12/23/2022   Hyperemesis 12/23/2022   Chronic hypertension affecting pregnancy 12/09/2022    Prenatal Labs: ABO, Rh:  A pos Antibody: Negative (05/09 0000) Rubella: Immune (05/09 0000)  RPR: Nonreactive (05/09 0000)  HBsAg: Negative (05/09 0000)  HIV: Non-reactive (05/09 0000)  GTT: normal GBS: Negative/-- (10/04 0000)  GC/CHL: neg/neg Genetics: low-risk, Horizon-Beta-Hemoglobinopathies Vaccines: Tdap: declined Influenza: declined   OB History  Gravida Para Term Preterm AB Living  9 2 2   6 2   SAB IAB Ectopic Multiple Live Births    6   0 2    # Outcome Date GA Lbr Len/2nd Weight Sex Type Anes PTL Lv  9 Current           8 IAB 06/2022          7 IAB 2022          6 IAB 09/2017 [redacted]w[redacted]d         5 Term 11/07/14 [redacted]w[redacted]d 15:17 / 00:36 3277 g F Vag-Spont EPI  LIV  4 IAB 2014 [redacted]w[redacted]d         3 IAB 08/24/11 [redacted]w[redacted]d         2 Term 11/01/06    M Vag-Vacuum   LIV  1 IAB             Medical / Surgical History: Past medical history:  Past Medical History:  Diagnosis Date   Dehydration 03/16/2018   Hypokalemia 03/16/2018   Narcolepsy    Narcolepsy without cataplexy 05/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 She has never used smokeless tobacco. She reports that she does not drink alcohol and does not use drugs.  Allergies: Patient has no known allergies.   Current Medications at time of admission:  Prior to Admission medications   Medication Sig Start Date End Date Taking? Authorizing Provider  Prenatal Vit-Fe Fumarate-FA (PRENATAL COMPLETE) 14-0.4 MG TABS Take 1 tablet by mouth daily. 12/09/22   Venora Maples, MD  prochlorperazine (COMPAZINE) 10 MG tablet Take 1 tablet (10 mg total) by mouth every 6 (six) hours as needed for nausea or vomiting. Patient not taking: Reported on 03/19/2023 12/09/22   Venora Maples, MD  promethazine (PHENERGAN) 25 MG suppository Place 1 suppository (25 mg total) rectally every 6 (six) hours as  needed for nausea or vomiting. Patient not taking: Reported on 03/19/2023 12/09/22   Venora Maples, MD  promethazine (PHENERGAN) 25 MG tablet Take 1 tablet (25 mg total) by mouth every 6 (six) hours as needed for nausea or vomiting. Patient not taking: Reported on 03/19/2023 12/09/22   Venora Maples, MD    Review of Systems based on last office visit: Constitutional: Negative   HENT: Negative   Eyes: Negative   Respiratory: Negative   Cardiovascular: Negative   Gastrointestinal: Negative  Genitourinary: neg for bloody show, neg for LOF   Musculoskeletal: Negative   Skin: Negative   Neurological: Negative   Endo/Heme/Allergies: Negative   Psychiatric/Behavioral: Negative    Physical Exam based on last office visit on 07/29/23: AAO x3, no signs of distress Cardiovascular: RRR Respiratory: Unlabored GU/GI: Abdomen gravid, non-tender, non-distended, active FM,  vertex Extremities: 1+ edema, negative for pain, tenderness, and cords  Cervical exam: closed/thick/ballottable  Prenatal Transfer Tool  Maternal Diabetes: No Genetic Screening: Abnormal:  Results: Other:Beta-Hemoglobinopathies Maternal Ultrasounds/Referrals: Other:potential SGA - 10%ile at 28 wks, 20%ile @ 38+5 wks Fetal Ultrasounds or other Referrals:  Referred to Materal Fetal Medicine  Maternal Substance Abuse:  No Significant Maternal Medications:  Meds include: Other: Procardia for chronic hypertension Significant Maternal Lab Results: Group B Strep negative Number of Prenatal Visits:greater than 3 verified prenatal visits Maternal Vaccinations: declined Other Comments:  None    Assessment: 32 y.o. [id number] [redacted]w[redacted]d  IOL for dates Potential for SGA GBS negative Pain management plan: per pt's request   Plan:  Admit to L&D Routine admission orders Epidural PRN Method of induction based on admission exam   Roma Schanz DNP, CNM 07/30/2023 7:27 PM

## 2023-07-31 NOTE — H&P (Incomplete)
Cynthia [name] is a 32 y.o. female, [id number], IUP at 41 weeks, presenting for IOL for postdates. Pt endorse + Fm. Denies vaginal leakage. Denies vaginal bleeding. Denies feeling cxt's. 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 H/O narcolepsy in 2012. GBS-. LR female.    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 180 tablet 2    prochlorperazine (COMPAZINE) 10 MG tablet Take 1 tablet (10 mg total) by mouth every 6 (six) hours as needed for nausea or vomiting. (Patient not taking: Reported on 03/19/2023) 30 tablet 0    promethazine (PHENERGAN) 25 MG suppository Place 1 suppository (25 mg total) rectally every 6 (six) hours as needed for nausea or vomiting. (Patient not taking: Reported on 03/19/2023) 12 each 0     promethazine (PHENERGAN) 25 MG tablet Take 1 tablet (25 mg total) by mouth every 6 (six) hours as needed for nausea or vomiting. 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   Current Outpatient Medications on File Prior to Encounter  Medication Sig Dispense Refill   Prenatal Vit-Fe Fumarate-FA (PRENATAL COMPLETE) 14-0.4 MG TABS Take 1 tablet by mouth daily. 180 tablet 2   prochlorperazine (COMPAZINE) 10 MG tablet Take 1 tablet (10 mg total) by mouth every 6 (six) hours as needed for nausea or vomiting. (Patient not taking: Reported on 03/19/2023) 30 tablet 0   promethazine (PHENERGAN) 25 MG suppository Place 1 suppository (25 mg total) rectally every 6 (six) hours as needed for nausea or vomiting. (Patient not taking: Reported on 03/19/2023) 12 each 0   promethazine (PHENERGAN) 25 MG tablet Take 1 tablet (25 mg total) by mouth every 6 (six) hours as needed for nausea or vomiting. (Patient not taking: Reported on 03/19/2023) 30 tablet 3     No Known Allergies  History of present pregnancy: Pt Info/Preference:  Screening/Consents:  Labs:   EDD: Estimated Date of Delivery: 07/25/23  Establised: Patient's last menstrual period was 10/25/2022.  Anatomy Scan: Date: 6/30 @ 22 weeks Placenta Location: posterior wit fetal EIF Genetic Screen: Panoroma:LR female AFP:  First Tri: Quad:  Office: [facility]            First PNV: 12.6 weeks Blood Type --/--/A POS (11/10 1335)A+  Language: english Last PNV: 40.5 weeks Rhogam  N/A  Flu Vaccine:  declined   Antibody NEG (11/10 1335)  TDaP vaccine declined   GTT: Early: 4.8 Third  Trimester: 60  Feeding Plan: Breat/bottle BTL: no Rubella: Immune (05/09 0000)  Contraception: ??? VBAC: no RPR: Nonreactive (05/09 0000)   Circumcision: N/A   HBsAg: Negative (05/09 0000)  Pediatrician:  ???   HIV: Non-reactive (05/09 0000)   Prenatal Classes: no Additional [place]: EFW 6.6lbs 20% 38 weeks GBS: Negative/-- (10/04 0000)(For PCN allergy, check sensitivities)       Chlamydia: neg    MFM Referral/Consult:  GC: neg  Support Person: parnter   PAP: 2024 negative  Pain Management: epidural Neonatologist Referral:  Hgb Electrophoresis:  HgC  Birth Plan: DCC/Vaginal   Hgb NOB: 12.3    28W: 10.3   OB History     Gravida  9   Para  2   Term  2   Preterm      AB  6   Living  2      SAB      IAB  6   Ectopic      Multiple  0   Live Births  2          Past Medical History:  Diagnosis Date   Dehydration 03/16/2018   Hypokalemia 03/16/2018   Narcolepsy    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 Social History:  reports that she has never smoked. She has never used smokeless tobacco. She reports that she does not drink alcohol and does not use drugs.   Prenatal Transfer Tool  Maternal Diabetes: No Genetic Screening: Normal Maternal Ultrasounds/Referrals: Isolated EIF (echogenic intracardiac focus) Fetal Ultrasounds or other Referrals:  Referred to Materal Fetal Medicine  Maternal Substance Abuse:  No Significant Maternal Medications:  None Significant Maternal Lab Results: Group B Strep negative  ROS:  Review of Systems  Constitutional: Negative.   HENT: Negative.    Eyes: Negative.   Respiratory: Negative.     Cardiovascular: Negative.   Gastrointestinal: Negative.   Genitourinary: Negative.   Musculoskeletal: Negative.   Skin: Negative.   Neurological: Negative.   Endo/Heme/Allergies: Negative.   Psychiatric/Behavioral: Negative.       Physical Exam: BP (!) 134/93   Pulse 82   Temp 98.2 F (36.8 C) (Oral)   Resp 20   Ht 4\' 11"  (1.499 m)   Wt 85.8 kg   LMP 10/25/2022   BMI 38.21 kg/m   Physical Exam Vitals and nursing note reviewed.  Constitutional:      Appearance: Normal appearance.  HENT:     Head: Normocephalic and atraumatic.     Nose: Nose normal.     Mouth/Throat:     Pharynx: Oropharynx is clear.  Eyes:     Conjunctiva/sclera:  Conjunctivae normal.  Cardiovascular:     Rate and Rhythm: Normal rate and regular rhythm.     Pulses: Normal pulses.     Heart sounds: Normal heart sounds.  Pulmonary:     Effort: Pulmonary effort is normal.     Breath sounds: Normal breath sounds.  Abdominal:     General: Bowel sounds are normal.  Genitourinary:    Comments: Uterus gravida and pelvis adequate.  Musculoskeletal:        General: Normal range of motion.     Cervical back: Normal range of motion and neck supple.  Skin:    General: Skin is warm.     Capillary Refill: Capillary refill takes less than 2 seconds.  Neurological:     General: No focal deficit present.     Mental Status: She is alert.  Psychiatric:        Mood and Affect: Mood normal.      NST: FHR baseline 130 bpm, Variability: moderate, Accelerations:present, Decelerations:  Absent= Cat 1/Reactive UC:   occ SVE:  Dilation: 5.5 Effacement (%): 80 Station: -1 Exam by:: [facility], CNM, vertex verified by fetal sutures.  Leopold's: Position vertex, EFW 7.5lbs via leopold's. Pelvis proven 7.8lbs.    Labs: Results for orders placed or performed during the hospital encounter of 08/01/23 (from the past 24 hour(s))  Comprehensive metabolic panel     Status: Abnormal   Collection Time: 08/01/23  1:35  PM  Result Value Ref Range   Sodium 134 (L) 135 - 145 mmol/L   Potassium 4.2 3.5 - 5.1 mmol/L   Chloride 106 98 - 111 mmol/L   CO2 18 (L) 22 - 32 mmol/L   Glucose, Bld 80 70 - 99 mg/dL   BUN 14 6 - 20 mg/dL   Creatinine, Ser 1.61 (H) 0.44 - 1.00 mg/dL   Calcium 9.4 8.9 - 09.6 mg/dL   Total Protein 7.0 6.5 - 8.1 g/dL   Albumin 2.9 (L) 3.5 - 5.0 g/dL   AST 21 15 - 41 U/L   ALT 17 0 - 44 U/L   Alkaline Phosphatase 120 38 - 126 U/L   Total Bilirubin 0.6 <1.2 mg/dL   GFR, Estimated >04 >54 mL/min   Anion gap 10 5 - 15  Protein / creatinine ratio, urine     Status: Abnormal   Collection Time: 08/01/23  1:35 PM  Result Value Ref Range   Creatinine, Urine 156 mg/dL   Total Protein, Urine 30 mg/dL   Protein Creatinine Ratio 0.19 (H) 0.00 - 0.15 mg/mg[Cre]  CBC     Status: Abnormal   Collection Time: 08/01/23  1:35 PM  Result Value Ref Range   WBC 10.4 4.0 - 10.5 K/uL   RBC 4.48 3.87 - 5.11 MIL/uL   Hemoglobin 12.0 12.0 - 15.0 g/dL   HCT 09.8 (L) 11.9 - 14.7 %   MCV 74.6 (L) 80.0 - 100.0 fL   MCH 26.8 26.0 - 34.0 pg   MCHC 35.9 30.0 - 36.0 g/dL   RDW 82.9 (H) 56.2 - 13.0 %   Platelets 261 150 - 400 K/uL   nRBC 0.0 0.0 - 0.2 %  Type and screen     Status: None   Collection Time: 08/01/23  1:35 PM  Result Value Ref Range   ABO/RH(D) A POS    Antibody Screen NEG    Sample Expiration      08/04/2023,2359 Performed at [facility], [address] [place], [place] [address]  Imaging:  No results found.  MAU Course: Orders Placed This Encounter  Procedures   Comprehensive metabolic panel   Protein / creatinine ratio, urine   CBC   RPR   Diet clear liquid Room service appropriate? Yes; Fluid consistency: Thin   Evaluate fetal heart rate to establish reassuring pattern prior to initiating Cytotec or Pitocin   Perform a cervical exam prior to initiating Cytotec or Pitocin   Discontinue Pitocin if tachysystole with non-reassuring FHR is present   Notify physician  (specify) Tachysystole is defined as more than 5 contractions in a 10-minute time period averaged over a 30-minute window   Initiate intrauterine resuscitation if tachysystole with non-reassuring FHR is present   Notify physician (specify) Tachysystole is defined as more than 5 contractions in a 10-minute time period averaged over a 30-minute window   May administer Terbutaline 0.25 mg SQ x 1 dose if tachysystole with non-reassuring FHR is present   Labor Induction   Patient may have epidural   Vitals signs per unit policy   Notify physician (specify)   Fetal monitoring per unit policy   Activity as tolerated   Cervical Exam   Measure blood pressure post delivery every 15 min x 1 hour then every 30 min x 1 hour   Fundal check post delivery every 15 min x 1 hour then every 30 min x 1 hour   Apply Labor & Delivery Care Plan   If Rapid HIV test positive or known HIV positive: initiate AZT orders   May in and out cath x 2 for inability to void   Insert urethral catheter X 1 PRN If Coude Catheter is chosen, qualified resources by campus can be found in the clinical skills nursing procedure for Coude Catheter 1. If straight catheterized > 2 times or patient unable to void post epidural plac...   Refer to Sidebar Report Urinary (Foley) Catheter Indications   Refer to Sidebar Report Post Indwelling Urinary Catheter Removal and Intervention Guidelines   Discontinue foley prior to vaginal delivery   Initiate Oral Care Protocol   Initiate Carrier Fluid Protocol   SCDs   Informed Consent Details: Physician/Practitioner Attestation; Transcribe to consent form and obtain patient signature   Full code   Nitrous Oxide 50%/Oxygen 50%   Type and screen   Insert and maintain IV Line   Admit to Inpatient (patient's expected length of stay will be greater than 2 midnights or inpatient only procedure)   Meds ordered this encounter  Medications   terbutaline (BRETHINE) injection 0.25 mg   AND Linked Order  Group    misoprostol (CYTOTEC) tablet 50 mcg    misoprostol (CYTOTEC) tablet 25 mcg   misoprostol (CYTOTEC) tablet 25 mcg   oxytocin (PITOCIN) IV infusion 30 units in NS 500 mL - Premix    Order Specific Question:   Begin infusion at:    Answer:   2 milli-units/min (2 mL/hr)    Order Specific Question:   Increase infusion by:    Answer:   2 milli-units/min (2 mL/hr)   fentaNYL (SUBLIMAZE) injection 100 mcg   DISCONTD: lactated ringers infusion   oxytocin (PITOCIN) IV BOLUS FROM BAG   oxytocin (PITOCIN) IV infusion 30 units in NS 500 mL - Premix   lactated ringers infusion 500-1,000 mL   acetaminophen (TYLENOL) tablet 650 mg   sodium phosphate (FLEET) enema 1 enema   ondansetron (ZOFRAN) injection 4 mg   sodium citrate-citric acid (ORACIT) solution 30 mL   lidocaine (PF) (XYLOCAINE) 1 %  injection 30 mL    Assessment/Plan: Alfred P Routh is a 32 y.o. female, [id number], IUP at 41 weeks, presenting for IOL for postdates. Pt endorse + Fm. Denies vaginal leakage. Denies vaginal bleeding. Denies feeling cxt's. 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 H/O narcolepsy in 2012. GBS-. LR female.   FWB: Cat 1 Fetal Tracing.   Plan: Admit to [facility]  Routine [facility] orders Pain med/epidural prn CHTN: cbc, cmp, pcr upon admission, monitor BP Cytotec for cervical ripening.  Anticipate labor progression   [facility] CNM, FNP-C, PMHNP-BC  [address]  [place], [place] [address]  Cell: [phone]  Office Phone: [phone] Fax: [phone] 08/01/2023  3:11 PM

## 2023-08-01 ENCOUNTER — Inpatient Hospital Stay (HOSPITAL_COMMUNITY): Payer: Medicaid Other

## 2023-08-01 ENCOUNTER — Other Ambulatory Visit: Payer: Self-pay

## 2023-08-01 ENCOUNTER — Inpatient Hospital Stay (HOSPITAL_COMMUNITY): Payer: Medicaid Other | Admitting: Anesthesiology

## 2023-08-01 ENCOUNTER — Encounter (HOSPITAL_COMMUNITY): Payer: Self-pay | Admitting: Obstetrics and Gynecology

## 2023-08-01 ENCOUNTER — Inpatient Hospital Stay (HOSPITAL_COMMUNITY)
Admission: RE | Admit: 2023-08-01 | Discharge: 2023-08-03 | DRG: 797 | Disposition: A | Discharge status: Home / Self Care | Payer: Medicaid Other | Attending: Obstetrics and Gynecology | Admitting: Obstetrics and Gynecology

## 2023-08-01 DIAGNOSIS — O480 Post-term pregnancy: Secondary | ICD-10-CM | POA: Diagnosis present

## 2023-08-01 DIAGNOSIS — O1092 Unspecified pre-existing hypertension complicating childbirth: Secondary | ICD-10-CM | POA: Diagnosis present

## 2023-08-01 DIAGNOSIS — Z8249 Family history of ischemic heart disease and other diseases of the circulatory system: Secondary | ICD-10-CM | POA: Diagnosis not present

## 2023-08-01 DIAGNOSIS — Z3A40 40 weeks gestation of pregnancy: Secondary | ICD-10-CM | POA: Diagnosis not present

## 2023-08-01 LAB — COMPREHENSIVE METABOLIC PANEL
ALT: 17 U/L (ref 0–44)
AST: 21 U/L (ref 15–41)
Albumin: 2.9 g/dL — ABNORMAL LOW (ref 3.5–5.0)
Alkaline Phosphatase: 120 U/L (ref 38–126)
Anion gap: 10 (ref 5–15)
BUN: 14 mg/dL (ref 6–20)
CO2: 18 mmol/L — ABNORMAL LOW (ref 22–32)
Calcium: 9.4 mg/dL (ref 8.9–10.3)
Chloride: 106 mmol/L (ref 98–111)
Creatinine, Ser: 1.07 mg/dL — ABNORMAL HIGH (ref 0.44–1.00)
GFR, Estimated: >60 mL/min (ref >60)
Glucose, Bld: 80 mg/dL (ref 70–99)
Potassium: 4.2 mmol/L (ref 3.5–5.1)
Sodium: 134 mmol/L — ABNORMAL LOW (ref 135–145)
Total Bilirubin: 0.6 mg/dL (ref <1.2)
Total Protein: 7 g/dL (ref 6.5–8.1)

## 2023-08-01 LAB — TYPE AND SCREEN
ABO/RH(D): A POS
Antibody Screen: NEGATIVE

## 2023-08-01 LAB — PROTEIN / CREATININE RATIO, URINE
Creatinine, Urine: 156 mg/dL
Protein Creatinine Ratio: 0.19 mg/mg{creat} — ABNORMAL HIGH (ref 0.00–0.15)
Total Protein, Urine: 30 mg/dL

## 2023-08-01 LAB — CBC
HCT: 33.4 % — ABNORMAL LOW (ref 36.0–46.0)
Hemoglobin: 12 g/dL (ref 12.0–15.0)
MCH: 26.8 pg (ref 26.0–34.0)
MCHC: 35.9 g/dL (ref 30.0–36.0)
MCV: 74.6 fL — ABNORMAL LOW (ref 80.0–100.0)
Platelets: 261 10*3/uL (ref 150–400)
RBC: 4.48 MIL/uL (ref 3.87–5.11)
RDW: 16.2 % — ABNORMAL HIGH (ref 11.5–15.5)
WBC: 10.4 10*3/uL (ref 4.0–10.5)
nRBC: 0 % (ref 0.0–0.2)

## 2023-08-01 MED ORDER — OXYTOCIN BOLUS FROM INFUSION
333.0000 mL | Freq: Once | INTRAVENOUS | Status: AC
  Administered 2023-08-01: 333 mL via INTRAVENOUS

## 2023-08-01 MED ORDER — TETANUS-DIPHTH-ACELL PERTUSSIS 5-2.5-18.5 LF-MCG/0.5 IM SUSY
0.5000 mL | PREFILLED_SYRINGE | Freq: Once | INTRAMUSCULAR | Status: DC
  Filled 2023-08-01: qty 0.5

## 2023-08-01 MED ORDER — OXYTOCIN-SODIUM CHLORIDE 30-0.9 UT/500ML-% IV SOLN: 1.0000 m[IU]/min | INTRAVENOUS | Status: DC

## 2023-08-01 MED ORDER — LACTATED RINGERS IV SOLN: INTRAVENOUS | Status: DC

## 2023-08-01 MED ORDER — OXYTOCIN-SODIUM CHLORIDE 30-0.9 UT/500ML-% IV SOLN
2.5000 [IU]/h | INTRAVENOUS | Status: DC
  Administered 2023-08-01: 2.5 [IU]/h via INTRAVENOUS

## 2023-08-01 MED ORDER — FENTANYL-BUPIVACAINE-NACL 0.5-0.125-0.9 MG/250ML-% EP SOLN
EPIDURAL | Status: AC
  Filled 2023-08-01: qty 250

## 2023-08-01 MED ORDER — IBUPROFEN 600 MG PO TABS
600.0000 mg | ORAL_TABLET | Freq: Four times a day (QID) | ORAL | Status: DC
Start: 2023-08-01 — End: 2023-08-03
  Administered 2023-08-01 – 2023-08-03 (×7): 600 mg via ORAL
  Filled 2023-08-01 (×7): qty 1

## 2023-08-01 MED ORDER — SODIUM CHLORIDE 0.9 % IV SOLN
3.0000 g | Freq: Once | INTRAVENOUS | Status: AC
  Administered 2023-08-01: 3 g via INTRAVENOUS
  Filled 2023-08-01: qty 8

## 2023-08-01 MED ORDER — ACETAMINOPHEN 325 MG PO TABS: 650.0000 mg | ORAL_TABLET | ORAL | Status: DC | PRN

## 2023-08-01 MED ORDER — COCONUT OIL OIL: 1.0000 | TOPICAL_OIL | Status: DC | PRN

## 2023-08-01 MED ORDER — ONDANSETRON HCL 4 MG/2ML IJ SOLN: 4.0000 mg | INTRAMUSCULAR | Status: DC | PRN

## 2023-08-01 MED ORDER — SENNOSIDES-DOCUSATE SODIUM 8.6-50 MG PO TABS
2.0000 | ORAL_TABLET | Freq: Every day | ORAL | Status: DC
  Administered 2023-08-02 – 2023-08-03 (×2): 2 via ORAL
  Filled 2023-08-01 (×2): qty 2

## 2023-08-01 MED ORDER — FLEET ENEMA RE ENEM: 1.0000 | ENEMA | RECTAL | Status: DC | PRN

## 2023-08-01 MED ORDER — WITCH HAZEL-GLYCERIN EX PADS: 1.0000 | MEDICATED_PAD | CUTANEOUS | Status: DC | PRN

## 2023-08-01 MED ORDER — ONDANSETRON HCL 4 MG PO TABS: 4.0000 mg | ORAL_TABLET | ORAL | Status: DC | PRN

## 2023-08-01 MED ORDER — OXYTOCIN-SODIUM CHLORIDE 30-0.9 UT/500ML-% IV SOLN
1.0000 m[IU]/min | INTRAVENOUS | Status: DC
  Administered 2023-08-01 (×2): 2 m[IU]/min via INTRAVENOUS
  Filled 2023-08-01: qty 500

## 2023-08-01 MED ORDER — MISOPROSTOL 50MCG HALF TABLET: 50.0000 ug | ORAL_TABLET | Freq: Once | ORAL | Status: DC

## 2023-08-01 MED ORDER — MISOPROSTOL 25 MCG QUARTER TABLET: 25.0000 ug | ORAL_TABLET | Freq: Once | ORAL | Status: DC

## 2023-08-01 MED ORDER — SOD CITRATE-CITRIC ACID 500-334 MG/5ML PO SOLN: 30.0000 mL | ORAL | Status: DC | PRN

## 2023-08-01 MED ORDER — LACTATED RINGERS IV SOLN
500.0000 mL | INTRAVENOUS | Status: DC | PRN
  Administered 2023-08-01: 1000 mL via INTRAVENOUS

## 2023-08-01 MED ORDER — LIDOCAINE HCL (PF) 1 % IJ SOLN
INTRAMUSCULAR | Status: DC | PRN
  Administered 2023-08-01: 5 mL via EPIDURAL

## 2023-08-01 MED ORDER — PRENATAL MULTIVITAMIN CH
1.0000 | ORAL_TABLET | Freq: Every day | ORAL | Status: DC
Start: 2023-08-02 — End: 2023-08-03
  Administered 2023-08-02 – 2023-08-03 (×2): 1 via ORAL
  Filled 2023-08-01 (×2): qty 1

## 2023-08-01 MED ORDER — TERBUTALINE SULFATE 1 MG/ML IJ SOLN: 0.2500 mg | Freq: Once | INTRAMUSCULAR | Status: DC | PRN

## 2023-08-01 MED ORDER — DIPHENHYDRAMINE HCL 25 MG PO CAPS: 25.0000 mg | ORAL_CAPSULE | Freq: Four times a day (QID) | ORAL | Status: DC | PRN

## 2023-08-01 MED ORDER — ZOLPIDEM TARTRATE 5 MG PO TABS: 5.0000 mg | ORAL_TABLET | Freq: Every evening | ORAL | Status: DC | PRN

## 2023-08-01 MED ORDER — SIMETHICONE 80 MG PO CHEW: 80.0000 mg | CHEWABLE_TABLET | ORAL | Status: DC | PRN

## 2023-08-01 MED ORDER — FENTANYL CITRATE (PF) 100 MCG/2ML IJ SOLN
100.0000 ug | INTRAMUSCULAR | Status: DC | PRN
  Administered 2023-08-01: 100 ug via INTRAVENOUS
  Filled 2023-08-01: qty 2

## 2023-08-01 MED ORDER — DIBUCAINE (PERIANAL) 1 % EX OINT: 1.0000 | TOPICAL_OINTMENT | CUTANEOUS | Status: DC | PRN

## 2023-08-01 MED ORDER — BENZOCAINE-MENTHOL 20-0.5 % EX AERO
1.0000 | INHALATION_SPRAY | CUTANEOUS | Status: DC | PRN
  Administered 2023-08-01: 1 via TOPICAL
  Filled 2023-08-01: qty 56

## 2023-08-01 MED ORDER — NIFEDIPINE ER OSMOTIC RELEASE 30 MG PO TB24
30.0000 mg | ORAL_TABLET | Freq: Every day | ORAL | Status: DC
  Administered 2023-08-01 – 2023-08-02 (×2): 30 mg via ORAL
  Filled 2023-08-01 (×2): qty 1

## 2023-08-01 MED ORDER — MISOPROSTOL 25 MCG QUARTER TABLET
25.0000 ug | ORAL_TABLET | ORAL | Status: DC | PRN
Start: 2023-08-01 — End: 2023-08-01

## 2023-08-01 MED ORDER — FENTANYL-BUPIVACAINE-NACL 0.5-0.125-0.9 MG/250ML-% EP SOLN
EPIDURAL | Status: DC | PRN
  Administered 2023-08-01: 12 mL/h via EPIDURAL

## 2023-08-01 MED ORDER — LIDOCAINE HCL (PF) 1 % IJ SOLN: 30.0000 mL | INTRAMUSCULAR | Status: DC | PRN

## 2023-08-01 MED ORDER — ONDANSETRON HCL 4 MG/2ML IJ SOLN: 4.0000 mg | Freq: Four times a day (QID) | INTRAMUSCULAR | Status: DC | PRN

## 2023-08-01 NOTE — Anesthesia Procedure Notes (Signed)
Epidural Patient location during procedure: OB Start time: 08/01/2023 4:02 PM End time: 08/01/2023 4:19 PM  Staffing Anesthesiologist: Trevor Iha, MD Performed: anesthesiologist   Preanesthetic Checklist Completed: patient identified, IV checked, site marked, risks and benefits discussed, surgical consent, monitors and equipment checked, pre-op evaluation and timeout performed  Epidural Patient position: sitting Prep: DuraPrep and site prepped and draped Patient monitoring: continuous pulse ox and blood pressure Approach: midline Location: L3-L4 Injection technique: LOR air  Needle:  Needle type: Tuohy  Needle gauge: 17 G Needle length: 9 cm and 9 Needle insertion depth: 6 cm Catheter type: closed end flexible Catheter size: 19 Gauge Catheter at skin depth: 12 cm Test dose: negative  Assessment Events: blood not aspirated, no cerebrospinal fluid, injection not painful, no injection resistance, no paresthesia and negative IV test  Additional Notes Patient identified. Risks/Benefits/Options discussed with patient including but not limited to bleeding, infection, nerve damage, paralysis, failed block, incomplete pain control, headache, blood pressure changes, nausea, vomiting, reactions to medication both or allergic, itching and postpartum back pain. Confirmed with bedside nurse the patient's most recent platelet count. Confirmed with patient that they are not currently taking any anticoagulation, have any bleeding history or any family history of bleeding disorders. Patient expressed understanding and wished to proceed. All questions were answered. Sterile technique was used throughout the entire procedure. Please see nursing notes for vital signs. Test dose was given through epidural needle and negative prior to continuing to dose epidural or start infusion. Warning signs of high block given to the patient including shortness of breath, tingling/numbness in hands, complete  motor block, or any concerning symptoms with instructions to call for help. Patient was given instructions on fall risk and not to get out of bed. All questions and concerns addressed with instructions to call with any issues. 1  Attempt (S) . Patient tolerated procedure well.

## 2023-08-01 NOTE — Progress Notes (Signed)
   Labor Progress Note  Cynthia [name] is a 32 y.o. female, [id number], IUP at 41 weeks, presenting for IOL for postdates. Pt endorse + Fm. Denies vaginal leakage. Denies vaginal bleeding. Denies feeling cxt's. 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 H/O narcolepsy in 2012. GBS-. LR female.   Subjective: Pt endorses feeling cxt and thinking about getting an epidural, pt pace breathing through cxt, discussed AROM R/B/A. Pt desires to start and pitocin reviewed with R/B/A and pt desires to start. 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 No intake/output data recorded. NST: FHR baseline 125 bpm, Variability: moderate, Accelerations:present, Decelerations:  Absent= Cat 1/Reactive CTX:  regular, every 2-3 minutes Uterus gravid, soft non tender, moderate to palpate with contractions.  SVE:  Dilation: 5.5 Effacement (%): 80 Station: -1 Exam by:: [facility], CNM Pitocin at 0 mUn/min AROM, clear and tolerated well.   Assessment:  Cynthia [name] is a 32 y.o. female, [id number], IUP at 41 weeks, presenting for IOL for postdates. Pt endorse + Fm. Denies vaginal leakage. Denies vaginal bleeding. Denies feeling cxt's. 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 H/O narcolepsy in 2012. GBS-. LR female. Progressing  in latent labor after AROM.  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   Plan: Continue labor plan Continuous monitoring Rest Ambulate Frequent position changes to facilitate fetal rotation and descent. Will reassess with cervical exam at 4 hours or earlier if necessary Start pitocin per protocol 2x2 Anticipate labor progression and vaginal delivery.  EBT : [address] CNM, FNP-C, PMHNP-BC  [address]  [place], [place] [address]  Cell: [phone]  Office Phone: [phone] Fax: [phone] 08/01/2023  3:12 PM

## 2023-08-01 NOTE — Lactation Note (Signed)
This note was copied from a baby's chart. Lactation Consultation Note  Patient Name: Cynthia [name] [id number] Date: 08/01/2023 Age:34 hours Reason for consult: Initial assessment;Term Mom stated the baby hadn't fed since she came to [facility]. LC stimulated baby to wake up. Noted baby had a smear of stool. Informed mom baby must be working on having a stool. Baby cueing. Set pillows up, placed baby STS in football hold. Newborn feeding habits, STS, I&O, support, props, body alignment reviewed. Mom encouraged to feed baby 8-12 times/24 hours and with feeding cues.  Mom stated she would start out BF her others but she didn't stick w/it. Mom states she is BF/formula feeding. Discussed cues for feeding. Noted body relaxed and breast softer after feeding. Praised mom. Encouraged to call for assistance as needed. Mom needed to go to [facility]. RN notified.   Maternal Data Has patient been taught Hand Expression?: Yes Does the patient have breastfeeding experience prior to this delivery?: Yes How long did the patient breastfeed?: a few days  Feeding    LATCH Score Latch: Grasps breast easily, tongue down, lips flanged, rhythmical sucking.  Audible Swallowing: A few with stimulation  Type of Nipple: Everted at rest and after stimulation  Comfort (Breast/Nipple): Filling, red/small blisters or bruises, mild/mod discomfort (filling, feels knotty)  Hold (Positioning): Assistance needed to correctly position infant at breast and maintain latch.  LATCH Score: 7   Lactation Tools Discussed/Used    Interventions Interventions: Breast feeding basics reviewed;Assisted with latch;Skin to skin;Breast massage;Hand express;Breast compression;Adjust position;Support pillows;Position options;Education;LC Services brochure  Discharge    Consult Status Consult Status: Follow-up Date: 08/02/23 Follow-up type: In-patient    Charyl Dancer 08/01/2023, 10:57 PM

## 2023-08-01 NOTE — Anesthesia Preprocedure Evaluation (Addendum)
Anesthesia Evaluation  Patient identified by MRN, date of birth, ID band Patient awake    Reviewed: Allergy & Precautions, NPO status , Patient's Chart, lab work & pertinent test results  Airway Mallampati: II  TM Distance: >3 FB Neck ROM: Full    Dental no notable dental hx. (+) Teeth Intact, Dental Advisory Given   Pulmonary neg pulmonary ROS   Pulmonary exam normal breath sounds clear to auscultation       Cardiovascular hypertension (cHtn), Normal cardiovascular exam Rhythm:Regular Rate:Normal     Neuro/Psych negative neurological ROS  negative psych ROS   GI/Hepatic negative GI ROS,,,(+) Hepatitis - (trait), C  Endo/Other    Renal/GU negative Renal ROS     Musculoskeletal   Abdominal  (+) + obese (BMI 38.2)  Peds  Hematology Lab Results      Component                Value               Date                      WBC                      10.4                08/01/2023                HGB                      12.0                08/01/2023                HCT                      33.4 (L)            08/01/2023                MCV                      74.6 (L)            08/01/2023                PLT                      261                 08/01/2023              Anesthesia Other Findings   Reproductive/Obstetrics (+) Pregnancy                              Anesthesia Physical Anesthesia Plan  ASA: 3  Anesthesia Plan: Epidural   Post-op Pain Management:    Induction:   PONV Risk Score and Plan:   Airway Management Planned:   Additional Equipment:   Intra-op Plan:   Post-operative Plan:   Informed Consent: I have reviewed the patients History and Physical, chart, labs and discussed the procedure including the risks, benefits and alternatives for the proposed anesthesia with the patient or authorized representative who has indicated his/her understanding and acceptance.        Plan Discussed with:   Anesthesia Plan Comments: (41 wk G9P2  for LEA)        Anesthesia Quick Evaluation

## 2023-08-02 LAB — COMPREHENSIVE METABOLIC PANEL
ALT: 16 U/L (ref 0–44)
AST: 24 U/L (ref 15–41)
Albumin: 2.5 g/dL — ABNORMAL LOW (ref 3.5–5.0)
Alkaline Phosphatase: 90 U/L (ref 38–126)
Anion gap: 6 (ref 5–15)
BUN: 11 mg/dL (ref 6–20)
CO2: 21 mmol/L — ABNORMAL LOW (ref 22–32)
Calcium: 8.8 mg/dL — ABNORMAL LOW (ref 8.9–10.3)
Chloride: 107 mmol/L (ref 98–111)
Creatinine, Ser: 0.96 mg/dL (ref 0.44–1.00)
GFR, Estimated: >60 mL/min (ref >60)
Glucose, Bld: 81 mg/dL (ref 70–99)
Potassium: 4.1 mmol/L (ref 3.5–5.1)
Sodium: 134 mmol/L — ABNORMAL LOW (ref 135–145)
Total Bilirubin: 0.6 mg/dL (ref <1.2)
Total Protein: 5.7 g/dL — ABNORMAL LOW (ref 6.5–8.1)

## 2023-08-02 LAB — CBC
HCT: 30.5 % — ABNORMAL LOW (ref 36.0–46.0)
Hemoglobin: 10.8 g/dL — ABNORMAL LOW (ref 12.0–15.0)
MCH: 26.7 pg (ref 26.0–34.0)
MCHC: 35.4 g/dL (ref 30.0–36.0)
MCV: 75.3 fL — ABNORMAL LOW (ref 80.0–100.0)
Platelets: 207 10*3/uL (ref 150–400)
RBC: 4.05 MIL/uL (ref 3.87–5.11)
RDW: 16.3 % — ABNORMAL HIGH (ref 11.5–15.5)
WBC: 14.9 10*3/uL — ABNORMAL HIGH (ref 4.0–10.5)
nRBC: 0 % (ref 0.0–0.2)

## 2023-08-02 LAB — RPR: RPR Ser Ql: NONREACTIVE

## 2023-08-02 MED ORDER — NIFEDIPINE ER OSMOTIC RELEASE 30 MG PO TB24
60.0000 mg | ORAL_TABLET | Freq: Every day | ORAL | Status: DC
  Administered 2023-08-03: 60 mg via ORAL
  Filled 2023-08-02: qty 2

## 2023-08-02 MED ORDER — NIFEDIPINE ER OSMOTIC RELEASE 30 MG PO TB24
30.0000 mg | ORAL_TABLET | Freq: Once | ORAL | Status: AC
Start: 2023-08-02 — End: 2023-08-02
  Administered 2023-08-02: 30 mg via ORAL
  Filled 2023-08-02: qty 1

## 2023-08-02 NOTE — Anesthesia Postprocedure Evaluation (Signed)
Anesthesia Post Note  Patient: Cynthia [name]  Procedure(s) Performed: AN AD HOC LABOR EPIDURAL     Patient location during evaluation: Mother Baby Anesthesia Type: Epidural Level of consciousness: awake and alert Pain management: pain level controlled Vital Signs Assessment: post-procedure vital signs reviewed and stable Respiratory status: spontaneous breathing, nonlabored ventilation and respiratory function stable Cardiovascular status: stable Postop Assessment: no headache, no backache and epidural receding Anesthetic complications: no   No notable events documented.  Last Vitals:  Vitals:   08/02/23 0133 08/02/23 0601  BP: (!) 140/91 136/82  Pulse: 88 78  Resp: 20 18  Temp: 36.5 C (!) 36.3 C  SpO2:      Last Pain:  Vitals:   08/02/23 0601  TempSrc: Oral  PainSc:    Pain Goal:                   Apollo Timothy

## 2023-08-02 NOTE — Plan of Care (Signed)
  Problem: Education: Goal: Knowledge of condition will improve Outcome: Progressing Goal: Individualized Educational Video(s) Outcome: Progressing Goal: Individualized Newborn Educational Video(s) Outcome: Progressing

## 2023-08-02 NOTE — Progress Notes (Signed)
Post Partum Day 1 Subjective: no complaints, up ad lib, tolerating PO, and no headache or RUQ pain  Objective: Blood pressure (!) 146/93, pulse 80, temperature 97.6 F (36.4 C), temperature source Oral, resp. rate 18, height 4\' 11"  (1.499 m), weight 85.8 kg, last menstrual period 10/25/2022, SpO2 100%, unknown if currently breastfeeding.  Physical Exam:  General: alert and cooperative Lochia: appropriate Uterine Fundus: firm Incision: na DVT Evaluation: Negative Homan's sign.  Recent Labs    08/01/23 1335 08/02/23 0437  HGB 12.0 10.8*  HCT 33.4* 30.5*    Assessment/Plan: Plan for discharge tomorrow and Breastfeeding Increase procardia to 60 xl every day pt is asymptomatic Hope for    LOS: 1 day   Michael Litter, MD 08/02/2023, 1:58 PM

## 2023-08-02 NOTE — Progress Notes (Signed)
Pt told the current NT(getting routine vitals) that she wished " we would leave her alone, someone always coming in here ".

## 2023-08-03 ENCOUNTER — Other Ambulatory Visit (HOSPITAL_COMMUNITY): Payer: Self-pay

## 2023-08-03 LAB — SURGICAL PATHOLOGY

## 2023-08-03 MED ORDER — BENZOCAINE-MENTHOL 20-0.5 % EX AERO
1.0000 | INHALATION_SPRAY | CUTANEOUS | 0 refills | Status: AC | PRN
  Filled 2023-08-03: qty 56, fill #0

## 2023-08-03 MED ORDER — IBUPROFEN 600 MG PO TABS
600.0000 mg | ORAL_TABLET | Freq: Four times a day (QID) | ORAL | 1 refills | Status: AC | PRN
  Filled 2023-08-03: qty 40, 10d supply, fill #0

## 2023-08-03 MED ORDER — ACETAMINOPHEN 325 MG PO TABS
650.0000 mg | ORAL_TABLET | Freq: Four times a day (QID) | ORAL | 1 refills | Status: AC | PRN
  Filled 2023-08-03: qty 40, 5d supply, fill #0

## 2023-08-03 MED ORDER — OXYCODONE HCL 5 MG PO TABS
5.0000 mg | ORAL_TABLET | Freq: Four times a day (QID) | ORAL | 0 refills | Status: AC | PRN
  Filled 2023-08-03: qty 10, 3d supply, fill #0

## 2023-08-03 MED ORDER — NIFEDIPINE ER 60 MG PO TB24
60.0000 mg | ORAL_TABLET | Freq: Every day | ORAL | 1 refills | Status: AC
  Filled 2023-08-03: qty 30, 30d supply, fill #0

## 2023-08-03 NOTE — Lactation Note (Signed)
This note was copied from a baby's chart. Lactation Consultation Note  Patient Name: Cynthia [name] [id number] Date: 08/03/2023 Age:34 hours Reason for consult: Follow-up assessment;Term  P3, Baby is breastfeeding and being formula feed. Assisted with latching with football hold.  Baby latched with ease.  Reviewed engorgement care and monitoring voids/stools.  Maternal Data Has patient been taught Hand Expression?: Yes Does the patient have breastfeeding experience prior to this delivery?: Yes  Feeding Mother's Current Feeding Choice: Breast Milk and Formula Nipple Type: Slow - flow  LATCH Score Latch: Grasps breast easily, tongue down, lips flanged, rhythmical sucking.  Audible Swallowing: A few with stimulation  Type of Nipple: Everted at rest and after stimulation  Comfort (Breast/Nipple): Soft / non-tender  Hold (Positioning): Assistance needed to correctly position infant at breast and maintain latch.  LATCH Score: 8 Interventions Interventions: Assisted with latch;Education  Discharge Discharge Education: Engorgement and breast care;Warning signs for feeding baby  Consult Status Consult Status: Complete Date: 08/03/23    Cynthia [name] [facility] 08/03/2023, 11:58 AM

## 2023-08-03 NOTE — Discharge Summary (Signed)
Postpartum Discharge Summary  Date of Service updated 08-03-23     Patient Name: Cynthia [name] DOB: [birth year] MRN: [id number]  Date of admission: 08/01/2023 Delivery date:08/01/2023 Delivering provider: Dale Flushing Date of discharge: 08/03/2023  Admitting diagnosis: Post-dates pregnancy [O48.0] Intrauterine pregnancy: [redacted]w[redacted]d     IOL Secondary diagnosis:  Principal Problem:   Post-dates pregnancy Active Problems:   SVD (spontaneous vaginal delivery)   Normal postpartum course  Additional problems: CHTN    Discharge diagnosis: Term Pregnancy Delivered and CHTN                                              Post partum procedures: none Augmentation:  Induction Complications: None  Hospital course: Induction of Labor With Vaginal Delivery   32 y.o. yo [id number] at [redacted]w[redacted]d was admitted to the hospital 08/01/2023 for induction of labor.  Indication for induction: Postdates.  Patient had an labor course complicated bynothing Membrane Rupture Time/Date: 3:00 PM,08/01/2023  Delivery Method:Vaginal, Spontaneous Operative Delivery:N/A Episiotomy: None Lacerations:  1st degree Details of delivery can be found in separate delivery note.  Patient had a postpartum course complicated by elevated BP treated with procardia xl 60mg  daily. Patient is discharged home 08/03/23.  Newborn Data: Birth date:08/01/2023 Birth time:5:52 PM Gender:Female Living status:Living Apgars:8 ,9  Weight:3380 g  Magnesium Sulfate received: No BMZ received: No Rhophylac:No MMR:No T-DaP: see prenatal record Flu: No RSV Vaccine received: No Transfusion:No Immunizations administered: Immunization History  Administered Date(s) Administered   Influenza Split 06/21/2014    Physical exam  Vitals:   08/02/23 0930 08/02/23 1235 08/02/23 2117 08/03/23 0530  BP: (!) 134/95 (!) 146/93 134/79 134/84  Pulse: 82 80 83 81  Resp: 18 18 17 17   Temp: 98.2 F (36.8 C) 97.6 F (36.4 C) 98 F (36.7 C) 97.9  F (36.6 C)  TempSrc: Oral Oral Oral Oral  SpO2: 100%  99% 99%  Weight:      Height:       General: alert, cooperative, and no distress Lochia: appropriate Uterine Fundus: firm Incision: N/A DVT Evaluation: No evidence of DVT seen on physical exam. Labs: Lab Results  Component Value Date   WBC 14.9 (H) 08/02/2023   HGB 10.8 (L) 08/02/2023   HCT 30.5 (L) 08/02/2023   MCV 75.3 (L) 08/02/2023   PLT 207 08/02/2023      Latest Ref Rng & Units 08/02/2023    4:37 AM  CMP  Glucose 70 - 99 mg/dL 81   BUN 6 - 20 mg/dL 11   Creatinine 2.95 - 1.00 mg/dL 1.88   Sodium 416 - 606 mmol/L 134   Potassium 3.5 - 5.1 mmol/L 4.1   Chloride 98 - 111 mmol/L 107   CO2 22 - 32 mmol/L 21   Calcium 8.9 - 10.3 mg/dL 8.8   Total Protein 6.5 - 8.1 g/dL 5.7   Total Bilirubin <3.0 mg/dL 0.6   Alkaline Phos 38 - 126 U/L 90   AST 15 - 41 U/L 24   ALT 0 - 44 U/L 16    Edinburgh Score:    08/03/2023    9:29 AM  Edinburgh Postnatal Depression Scale Screening Tool  I have been able to laugh and see the funny side of things. --      After visit meds:  Allergies as of 08/03/2023   No Known Allergies  Medication List     TAKE these medications    acetaminophen 325 MG tablet Commonly known as: Tylenol Take 2 tablets (650 mg total) by mouth every 6 (six) hours as needed for moderate pain (pain score 4-6) (for pain scale < 4).   benzocaine-Menthol 20-0.5 % Aero Commonly known as: DERMOPLAST Apply 1 Application topically as needed for irritation (perineal discomfort).   ibuprofen 600 MG tablet Commonly known as: ADVIL Take 1 tablet (600 mg total) by mouth every 6 (six) hours as needed.   NIFEdipine 60 MG 24 hr tablet Commonly known as: ADALAT CC Take 1 tablet (60 mg total) by mouth daily. Start taking on: August 04, 2023   oxyCODONE 5 MG immediate release tablet Commonly known as: Roxicodone Take 1 tablet (5 mg total) by mouth every 6 (six) hours as needed for severe pain  (pain score 7-10) or breakthrough pain.   Prenatal Complete 14-0.4 MG Tabs Take 1 tablet by mouth daily.   prochlorperazine 10 MG tablet Commonly known as: COMPAZINE Take 1 tablet (10 mg total) by mouth every 6 (six) hours as needed for nausea or vomiting.   promethazine 25 MG tablet Commonly known as: PHENERGAN Take 1 tablet (25 mg total) by mouth every 6 (six) hours as needed for nausea or vomiting.   promethazine 25 MG suppository Commonly known as: PHENERGAN Place 1 suppository (25 mg total) rectally every 6 (six) hours as needed for nausea or vomiting.         Discharge home in stable condition Infant Feeding: Bottle and Breast Infant Disposition:home with mother Discharge instruction: per After Visit Summary and Postpartum booklet. Activity: Advance as tolerated. Pelvic rest for 6 weeks.  Diet: routine diet Anticipated Birth Control:  Maybe IUD Postpartum Appointment:6 weeks Additional Postpartum F/U: BP check 1 week Future Appointments:No future appointments. Follow up Visit:  Follow-up Information     [facility]. Schedule an appointment as soon as possible for a visit in 1 week(s).   Specialty: Obstetrics and Gynecology Why: For Postpartum follow-up to have BP checked Contact information: [address] [place] [address] [phone]        [facility]. Schedule an appointment as soon as possible for a visit in 6 week(s).   Specialty: Obstetrics and Gynecology Why: For Postpartum follow-up Contact information: [address] [place] [address] [phone]                    08/03/2023 Purcell Nails, MD

## 2023-08-14 ENCOUNTER — Telehealth (HOSPITAL_COMMUNITY): Payer: Self-pay

## 2023-08-14 NOTE — Telephone Encounter (Signed)
08/14/2023 1045  Name: SHERRYE COLEGROVE MRN: [id number] DOB: 1991/05/08  Reason for Call:  Transition of Care Hospital Discharge Call  Contact Status: Patient Contact Status: Complete  Language assistant needed:          Follow-Up Questions: Do You Have Any Concerns About Your Health As You Heal From Delivery?: No Do You Have Any Concerns About Your Infants Health?: No  Edinburgh Postnatal Depression Scale:  In the Past 7 Days: I have been able to laugh and see the funny side of things.: As much as I always could I have looked forward with enjoyment to things.: Rather less than I used to I have blamed myself unnecessarily when things went wrong.: Yes, some of the time I have been anxious or worried for no good reason.: Yes, sometimes I have felt scared or panicky for no good reason.: No, not much Things have been getting on top of me.: No, most of the time I have coped quite well I have been so unhappy that I have had difficulty sleeping.: Not at all I have felt sad or miserable.: No, not at all I have been so unhappy that I have been crying.: No, never The thought of harming myself has occurred to me.: Never Edinburgh Postnatal Depression Scale Total: 7  PHQ2-9 Depression Scale:     Discharge Follow-up: Edinburgh score requires follow up?: No Patient was advised of the following resources:: Breastfeeding Support Group, Support Group  Post-discharge interventions: Reviewed Newborn Safe Sleep Practices  Signature  Signe Colt

## 2023-09-09 ENCOUNTER — Other Ambulatory Visit (HOSPITAL_COMMUNITY): Payer: Self-pay

## 2024-03-10 ENCOUNTER — Encounter: Payer: Self-pay | Admitting: Sleep Medicine

## 2024-03-10 ENCOUNTER — Ambulatory Visit (INDEPENDENT_AMBULATORY_CARE_PROVIDER_SITE_OTHER): Admitting: Sleep Medicine

## 2024-03-10 VITALS — BP 132/80 | HR 75 | Temp 97.1°F | Ht 59.0 in | Wt 159.6 lb

## 2024-03-10 DIAGNOSIS — G4733 Obstructive sleep apnea (adult) (pediatric): Secondary | ICD-10-CM

## 2024-03-10 DIAGNOSIS — E66811 Obesity, class 1: Secondary | ICD-10-CM | POA: Diagnosis not present

## 2024-03-10 DIAGNOSIS — Z6832 Body mass index (BMI) 32.0-32.9, adult: Secondary | ICD-10-CM

## 2024-03-10 NOTE — Progress Notes (Signed)
 Name:Cynthia [name] MRN: [id number] DOB: Aug 13, 1991   CHIEF COMPLAINT:  EXCESSIVE DAYTIME SLEEPINESS   HISTORY OF PRESENT ILLNESS:  Cynthia [name] is a 33 y.o. w/ a h/o obesity who presents for c/o loud snoring and excessive daytime sleepiness which has been present for several years. Reports that she was previously diagnosed with narcolepsy and started on Adderall which she was taking for a while. States that she ran out of her medication a while ago and did not follow up for refills. Since then has had significant weight changes and does report snoring and daytime sleepiness. Reports nocturnal awakenings due to unclear reasons, however does not have difficulty falling back to sleep. Reports a 30 lb weight gain over the last few years. Denies morning headaches, RLS symptoms, dream enactment, cataplexy, hypnagogic or hypnapompic hallucinations. Denies a family history of sleep apnea. Reports drowsy driving. Drinks 1 soda every few days, occasional alcohol use, denies tobacco or illicit drug use.   Bedtime 10:30-11 pm Sleep onset 1 min Rise time 6 am   EPWORTH SLEEP SCORE     03/10/2024   11:00 AM  Results of the Epworth flowsheet  Sitting and reading 3  Watching TV 3  Sitting, inactive in a public place (e.g. 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  has a past surgical history that includes Dilation and curettage of uterus. Prior to Admission medications   Medication Sig Start Date End  Date Taking? Authorizing Provider  acetaminophen  (TYLENOL ) 325 MG tablet Take 2 tablets (650 mg total) by mouth every 6 (six) hours as needed for moderate pain (pain score 4-6) (for pain scale < 4). Patient not taking: Reported on 03/10/2024 08/03/23   Renea Carrion, MD  benzocaine -Menthol  (DERMOPLAST) 20-0.5 % AERO Apply 1 Application topically as needed for irritation (perineal discomfort). Patient not taking: Reported on 03/10/2024 08/03/23   Renea Carrion, MD  ibuprofen  (ADVIL ) 600 MG tablet Take 1 tablet (600 mg total) by mouth every 6 (six) hours as needed. Patient not taking: Reported on 03/10/2024 08/03/23   Renea Carrion, MD  NIFEdipine  (ADALAT  CC) 60 MG 24 hr tablet Take 1 tablet (60 mg total) by mouth daily. Patient not taking: Reported on 03/10/2024 08/04/23   Renea Carrion, MD  oxyCODONE  (ROXICODONE ) 5 MG immediate release tablet Take 1 tablet (5 mg total) by mouth every 6 (six) hours as needed for severe pain (pain score 7-10) or breakthrough pain. Patient not taking: Reported on 03/10/2024 08/03/23   Renea Carrion, MD  Prenatal Vit-Fe Fumarate-FA (PRENATAL COMPLETE) 14-0.4 MG TABS Take 1 tablet by mouth daily. Patient not taking: Reported on 03/10/2024 12/09/22   Teena Feast, MD  prochlorperazine  (COMPAZINE ) 10 MG tablet Take 1 tablet (10 mg total) by mouth every 6 (six) hours as needed for nausea or vomiting. Patient not taking: Reported on 03/10/2024 12/09/22   Teena Feast, MD  promethazine  (PHENERGAN ) 25 MG suppository Place 1 suppository (25 mg total) rectally every 6 (  six) hours as needed for nausea or vomiting. Patient not taking: Reported on 03/10/2024 12/09/22   Teena Feast, MD  promethazine  (PHENERGAN ) 25 MG tablet Take 1 tablet (25 mg total) by mouth every 6 (six) hours as needed for nausea or vomiting. Patient not taking: Reported on 03/10/2024 12/09/22   Teena Feast, MD   No Known Allergies  FAMILY HISTORY:  family history includes Healthy in  her father and mother; Hypertension in an other family member. SOCIAL HISTORY:  reports that she has never smoked. She has never used smokeless tobacco. She reports that she does not drink alcohol and does not use drugs.   Review of Systems:  Gen:  Denies  fever, sweats, chills weight loss  HEENT: Denies blurred vision, double vision, ear pain, eye pain, hearing loss, nose bleeds, sore throat Cardiac:  No dizziness, chest pain or heaviness, chest tightness,edema, No JVD Resp:   No cough, -sputum production, -shortness of breath,-wheezing, -hemoptysis,  Gi: Denies swallowing difficulty, stomach pain, nausea or vomiting, diarrhea, constipation, bowel incontinence Gu:  Denies bladder incontinence, burning urine Ext:   Denies Joint pain, stiffness or swelling Skin: Denies  skin rash, easy bruising or bleeding or hives Endoc:  Denies polyuria, polydipsia , polyphagia or weight change Psych:   Denies depression, insomnia or hallucinations  Other:  All other systems negative  VITAL SIGNS: BP 132/80 (BP Location: Right Arm, Cuff Size: Normal)   Pulse 75   Temp (!) 97.1 F (36.2 C)   Ht 4' 11 (1.499 m)   Wt 159 lb 9.6 oz (72.4 kg)   SpO2 100%   BMI 32.24 kg/m    Physical Examination:   General Appearance: No distress  EYES PERRLA, EOM intact.   NECK Supple, No JVD Pulmonary: normal breath sounds, No wheezing.  CardiovascularNormal S1,S2.  No m/r/g.   Abdomen: Benign, Soft, non-tender. Skin:   warm, no rashes, no ecchymosis  Extremities: normal, no cyanosis, clubbing. Neuro:without focal findings,  speech normal  PSYCHIATRIC: Mood, affect within normal limits.   ASSESSMENT AND PLAN  OSA I suspect that OSA is likely present due to clinical presentation. Discussed the consequences of untreated sleep apnea. Advised not to drive drowsy for safety of patient and others. Will complete further evaluation with a home sleep study and follow up to review results.    Obesity Counseled  patient on diet and lifestyle modification.    MEDICATION ADJUSTMENTS/LABS AND TESTS ORDERED: Recommend Sleep Study   Patient  satisfied with Plan of action and management. All questions answered  Follow up to review HST results and treatment plan.   I spent a total of 47 minutes reviewing chart data, face-to-face evaluation with the patient, counseling and coordination of care as detailed above.    Torre Pikus, M.D.  Sleep Medicine [facility]

## 2024-03-10 NOTE — Patient Instructions (Signed)
 Aaron Aas

## 2024-06-01 ENCOUNTER — Telehealth: Payer: Self-pay | Admitting: Sleep Medicine

## 2024-06-01 NOTE — Telephone Encounter (Signed)
 Patient has now been scheduled to pick up our HST machine 9/121/25. When I called to schedule her she was alittle upset that is has taken so long to get her scheduled. The patient stated that she needs her adderall medicine she is falling asleep, she works Clinical biochemist, kids, I think I remember her stating going to school also. Patient would like for Dr. Jess to call her.

## 2024-06-02 ENCOUNTER — Encounter

## 2024-06-02 NOTE — Telephone Encounter (Signed)
 I spoke with the patient she is requesting a call back from you only to discuss her Adderall. She said she know she has narcolepsy and she is not waiting for a sleep study to come back before she gets her medication.  She wanted to make sure I told you she is a mom, works full time and has to drive and she needs her medication. She is very upset.

## 2024-06-05 ENCOUNTER — Other Ambulatory Visit: Payer: Self-pay | Admitting: Sleep Medicine

## 2024-06-05 MED ORDER — MODAFINIL 100 MG PO TABS: 100.0000 mg | ORAL_TABLET | Freq: Every day | ORAL | 3 refills | Status: DC

## 2024-06-05 NOTE — Progress Notes (Signed)
 Patient denies lactating or being pregnant currently, counseled patient on using a secondary form of birth control. Also counseled on the risks and potential side effects of Modafinil .

## 2024-06-06 NOTE — Telephone Encounter (Signed)
 Donzell please get her rescheduled for the HST.  Nothing further needed.

## 2024-06-07 NOTE — Telephone Encounter (Signed)
 I called the patient on 06/06/24 right before I left work and I had to leave a message

## 2024-06-09 ENCOUNTER — Telehealth: Payer: Self-pay | Admitting: Sleep Medicine

## 2024-06-09 NOTE — Telephone Encounter (Signed)
 I have this patient rescheduled to pick up our HST machine on 06/16/24. The patient has requested that Dr. Jess to call her back

## 2024-06-12 NOTE — Telephone Encounter (Signed)
 Dr. Jess would like to know what the patient would like to talk about.   LMTCB. E2C2 please advise when patient calls back.

## 2024-06-13 NOTE — Telephone Encounter (Signed)
 I tried to contact the patient. I did not get an answer. I will wait for her to contact our office back if she has further questions.  Nothing further needed.

## 2024-06-16 ENCOUNTER — Encounter

## 2024-10-03 ENCOUNTER — Ambulatory Visit: Admitting: Adult Health

## 2024-10-03 ENCOUNTER — Encounter: Payer: Self-pay | Admitting: Adult Health

## 2024-10-03 VITALS — BP 127/85 | HR 85 | Temp 98.2°F | Ht 59.0 in | Wt 157.6 lb

## 2024-10-03 DIAGNOSIS — R400 Somnolence: Secondary | ICD-10-CM

## 2024-10-03 DIAGNOSIS — G47419 Narcolepsy without cataplexy: Secondary | ICD-10-CM | POA: Diagnosis not present

## 2024-10-03 DIAGNOSIS — G4710 Hypersomnia, unspecified: Secondary | ICD-10-CM | POA: Diagnosis not present

## 2024-10-03 MED ORDER — AMPHETAMINE-DEXTROAMPHET ER 10 MG PO CP24: 10.0000 mg | ORAL_CAPSULE | Freq: Every day | ORAL | 0 refills | Status: AC

## 2024-10-03 NOTE — Progress Notes (Signed)
 "  @Patient  ID: Cynthia [name], female    DOB: 1991/05/05, 34 y.o.   MRN: [id number]  Chief Complaint  Patient presents with   Medical Management of Chronic Issues    Needs adderall refill    Referring provider: Lauretha Victory DEL, MD  HPI: 34 year old female never smoker followed for narcolepsy with hypersomnia    TEST/EVENTS : Reviewed 10/03/2024  NPSG- 10/09/09- AHI 0.2 per hour, RDI 20.4 per hour intermittent moderate snoring- MSLT 07/06/11- mean sleep latency 2.1 minutes with SOREM  5 (positive for Narcolepsy)   Discussed the use of AI scribe software for clinical note transcription with the patient, who gave verbal consent to proceed.  History of Present Illness Cynthia [name] is a 34 year old female with narcolepsy who presents for medication management. She was previously seen by Dr. Jess 6 months ago, who suggested a repeat home sleep study. She was unable to complete this due family and work obligations.  She was prescribed Provigil  but unfortunately insurance would not cover.  Patient has a longstanding history of narcolepsy with previous sleep study in 2011 negative for sleep apnea.  Multi sleep latency test October 2012 was positive for narcolepsy with a mean sleep latency at 2.1 minutes and SOREM5.  Patient had previously been on Adderall XR 20 mg in the past.  She had been changed off of this due to appetite and insomnia.  Patient says that she was unable to try additional medications.  Patient says she is just been managing symptoms herself without medications for the last few years.  Is having more more difficulty with daytime sleepiness that is affecting her daily life at home and at work.  She has a young child now.  Patient says she is currently not pregnant and not planning a pregnancy and is on birth control.  Not nursing. No cataplexy symptoms.  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 She has not been on medication for a while and wishes to restart treatment to better manage her symptoms.  She experiences nocturnal awakenings for about five to ten minutes, during which she might use her phone, but she is able to return to sleep. She does not consume caffeine during the day and is attempting to improve her diet.   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 (Patient not taking: Reported on 10/03/2024) 40 tablet 1   benzocaine -Menthol  (DERMOPLAST) 20-0.5 % AERO Apply 1 Application topically as needed for irritation (perineal discomfort). (Patient not taking: Reported on 10/03/2024) 56 g 0   ibuprofen  (ADVIL ) 600 MG tablet Take 1 tablet (600 mg total) by mouth every 6 (six) hours  as needed. (Patient not taking: Reported on 10/03/2024) 40 tablet 1   modafinil  (PROVIGIL ) 100 MG tablet Take 1 tablet (100 mg total) by mouth daily. (Patient not taking: Reported on 10/03/2024) 30 tablet 3   NIFEdipine  (ADALAT  CC) 60 MG 24 hr tablet Take 1 tablet (60 mg total) by mouth daily. (Patient not taking: Reported on 10/03/2024) 30 tablet 1   oxyCODONE  (ROXICODONE ) 5 MG immediate release tablet Take 1 tablet (5 mg total) by mouth every 6  (six) hours as needed for severe pain (pain score 7-10) or breakthrough pain. (Patient not taking: Reported on 10/03/2024) 10 tablet 0   Prenatal Vit-Fe Fumarate-FA (PRENATAL COMPLETE) 14-0.4 MG TABS Take 1 tablet by mouth daily. (Patient not taking: Reported on 10/03/2024) 180 tablet 2   prochlorperazine  (COMPAZINE ) 10 MG tablet Take 1 tablet (10 mg total) by mouth every 6 (six) hours as needed for nausea or vomiting. (Patient not taking: Reported on 10/03/2024) 30 tablet 0   promethazine  (PHENERGAN ) 25 MG suppository Place 1 suppository (25 mg total) rectally every 6 (six) hours as needed for nausea or vomiting. (Patient not taking: Reported on 10/03/2024) 12 each 0   promethazine  (PHENERGAN ) 25 MG tablet Take 1 tablet (25 mg total) by mouth every 6 (six) hours as needed for nausea or vomiting. (Patient not taking: Reported on 10/03/2024) 30 tablet 3   No facility-administered medications prior to visit.     Review of Systems:   Constitutional:   No  weight loss, night sweats,  Fevers, chills, +fatigue, or  lassitude.  HEENT:   No headaches,  Difficulty swallowing,  Tooth/dental problems, or  Sore throat,                No sneezing, itching, ear ache, nasal congestion, post nasal drip,   CV:  No chest pain,  Orthopnea, PND, swelling in lower extremities, anasarca, dizziness, palpitations, syncope.   GI  No heartburn, indigestion, abdominal pain, nausea, vomiting, diarrhea, change in bowel habits, loss of appetite, bloody stools.   Resp: No shortness of breath with exertion or at rest.  No excess mucus, no productive cough,  No non-productive cough,  No coughing up of blood.  No change in color of mucus.  No wheezing.  No chest wall deformity  Skin: no rash or lesions.  GU: no dysuria, change in color of urine, no urgency or frequency.  No flank pain, no hematuria   MS:  No joint pain or swelling.  No decreased range of motion.  No back pain.    Physical Exam  BP 127/85   Pulse 85    Temp 98.2 F (36.8 C)   Ht 4' 11 (1.499 m) Comment: Per pt  Wt 157 lb 9.6 oz (71.5 kg)   SpO2 99% Comment: RA  BMI 31.83 kg/m   GEN: A/Ox3; pleasant , NAD, well nourished    HEENT:  [place]/AT,  NOSE-clear, THROAT-clear, no lesions, no postnasal drip or exudate noted.   NECK:  Supple w/ fair ROM; no JVD; normal carotid impulses w/o bruits; no thyromegaly or nodules palpated; no lymphadenopathy.    RESP  Clear  P & A; w/o, wheezes/ rales/ or rhonchi. no accessory muscle use, no dullness to percussion  CARD:  RRR, no m/r/g, no peripheral edema, pulses intact, no cyanosis or clubbing.  GI:   Soft & nt; nml bowel sounds; no organomegaly or masses detected.   Musco: Warm bil, no deformities or joint swelling noted.   Neuro: alert, no focal deficits  noted.    Skin: Warm, no lesions or rashes    Lab Results:Reviewed 10/03/2024   CBC   BNP No results found for: BNP  ProBNP No results found for: PROBNP  Imaging: No results found.  Administration History     None           No data to display          No results found for: NITRICOXIDE     03/10/2024   11:00 AM  Results of the Epworth flowsheet  Sitting and reading 3  Watching TV 3  Sitting, inactive in a public place (e.g. a theatre or a meeting) 2  As a passenger in a car for an hour without a break 3  Lying down to rest in the afternoon when circumstances permit 3  Sitting and talking to someone 2  Sitting quietly after a lunch without alcohol 3  In a car, while stopped for a few minutes in traffic 3  Total score 22        Assessment & Plan:   Assessment and Plan Assessment & Plan Narcolepsy  -ongoing symptom burden Chronic narcolepsy persists with daytime sleepiness not on therapy  She reports falling asleep at work and in public, affecting daily life.  Nighttime sleep is disrupted by frequent awakenings, but she generally returns to sleep. She consumes no caffeine during the day and is not  pregnant, using birth control. A recent home sleep study was not completed due to scheduling conflicts. She agreed to repeat the home sleep study due to the time since the initial diagnosis. Restart Adderall XR  10 mg daily in the morning.  Stimulant medication discussion given.  A home sleep study is ordered to reassess for possible underlying sleep apnea . She is instructed to bring FMLA paperwork to the next appointment for potential workplace accommodations. A follow-up appointment is scheduled in one month to evaluate medication efficacy and review sleep study results. Discussion regarding management of narcolepsy symptoms.  Patient instruction sheet given  Plan  Patient Instructions  Restart Adderall XR 10mg  daily in am.  Set up for home sleep study Bring FMLA paperwork to next visit.  Follow up in 1 month with Dr. Olena or Kristi Hyer NP and As needed          I spent  30  minutes dedicated to the care of this patient on the date of this encounter to include pre-visit review of records, face-to-face time with the patient discussing conditions above, post visit ordering of testing, clinical documentation with the electronic health record, making appropriate referrals as documented, and communicating necessary findings to members of the patients care team.     Madelin Stank, NP 10/03/2024      [1] No Known Allergies [2]  Social History Tobacco Use  Smoking Status Never  Smokeless Tobacco Never   "

## 2024-10-03 NOTE — Patient Instructions (Addendum)
 Restart Adderall XR 10mg  daily in am.  Set up for home sleep study Bring FMLA paperwork to next visit.  Follow up in 1 month with Dr. Olena or Mayda Shippee NP and As needed

## 2024-11-03 ENCOUNTER — Encounter

## 2024-11-10 ENCOUNTER — Encounter
